# Patient Record
Sex: Female | Born: 1975 | Race: Black or African American | Hispanic: No | Marital: Single | Smoking: Never smoker
Health system: Southern US, Community
[De-identification: ages and names within clinical notes are randomized; demographics above are authoritative.]

## PROBLEM LIST (undated history)

## (undated) DIAGNOSIS — R42 Dizziness and giddiness: Secondary | ICD-10-CM

## (undated) DIAGNOSIS — I890 Lymphedema, not elsewhere classified: Secondary | ICD-10-CM

## (undated) DIAGNOSIS — E785 Hyperlipidemia, unspecified: Secondary | ICD-10-CM

## (undated) DIAGNOSIS — M1990 Unspecified osteoarthritis, unspecified site: Secondary | ICD-10-CM

## (undated) DIAGNOSIS — G43909 Migraine, unspecified, not intractable, without status migrainosus: Secondary | ICD-10-CM

## (undated) DIAGNOSIS — D219 Benign neoplasm of connective and other soft tissue, unspecified: Secondary | ICD-10-CM

## (undated) DIAGNOSIS — J45909 Unspecified asthma, uncomplicated: Secondary | ICD-10-CM

## (undated) HISTORY — DX: Migraine, unspecified, not intractable, without status migrainosus: G43.909

## (undated) HISTORY — DX: Benign neoplasm of connective and other soft tissue, unspecified: D21.9

## (undated) HISTORY — DX: Dizziness and giddiness: R42

## (undated) HISTORY — DX: Unspecified osteoarthritis, unspecified site: M19.90

## (undated) HISTORY — DX: Hyperlipidemia, unspecified: E78.5

## (undated) HISTORY — PX: KNEE ARTHROSCOPY: SUR90

## (undated) HISTORY — DX: Unspecified asthma, uncomplicated: J45.909

## (undated) HISTORY — DX: Lymphedema, not elsewhere classified: I89.0

---

## 2000-04-06 DIAGNOSIS — R002 Palpitations: Secondary | ICD-10-CM | POA: Insufficient documentation

## 2005-12-26 IMAGING — CR DG CHEST 2V
1 series · 2 of 2 positions shown · non-contrast
Comparison: none

REASON FOR EXAM: chest pain
COMMENTS:

PROCEDURE:     DXR - DXR CHEST PA (OR AP) AND LATERAL  - [DATE]  [DATE]
RESULT:       The lung fields are clear. The heart, mediastinal and osseous
structures show no significant abnormalities.

[Series 1: view not recorded · 0.17mm/px · 2 of 2 slices shown]
[im 1/2]
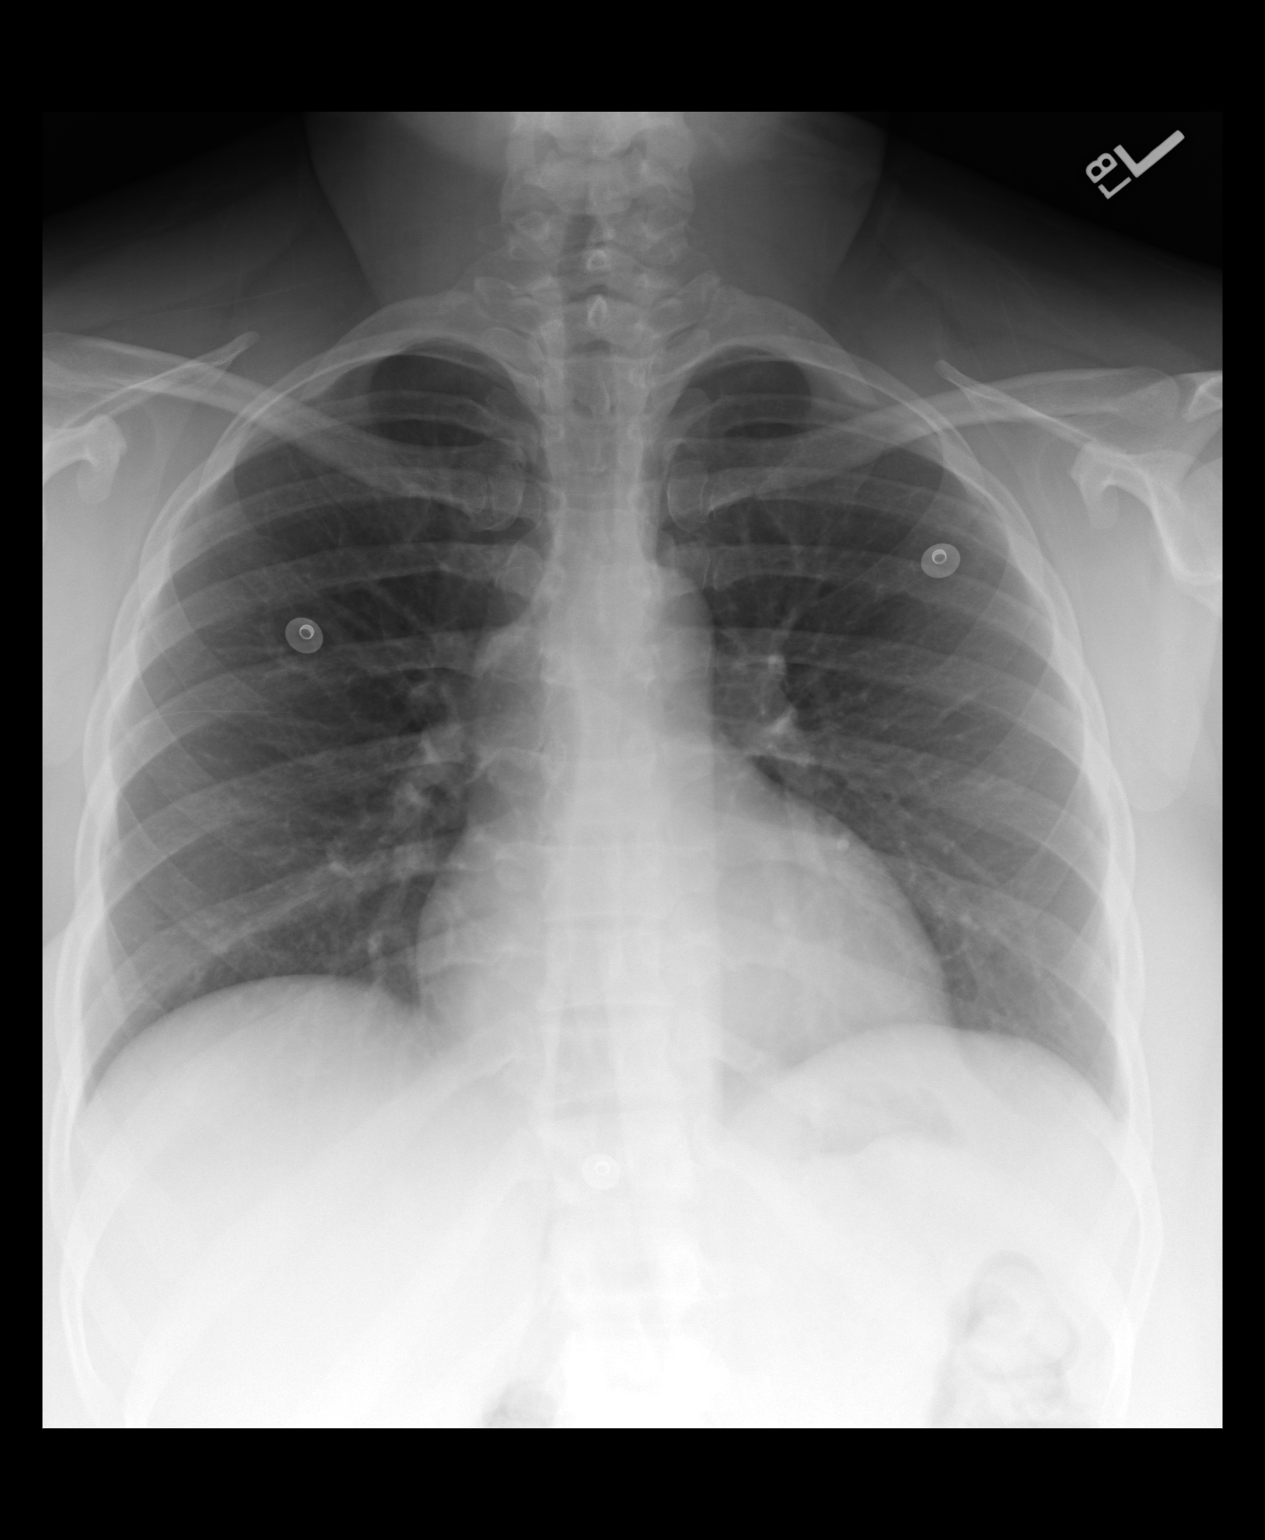
[im 2/2]
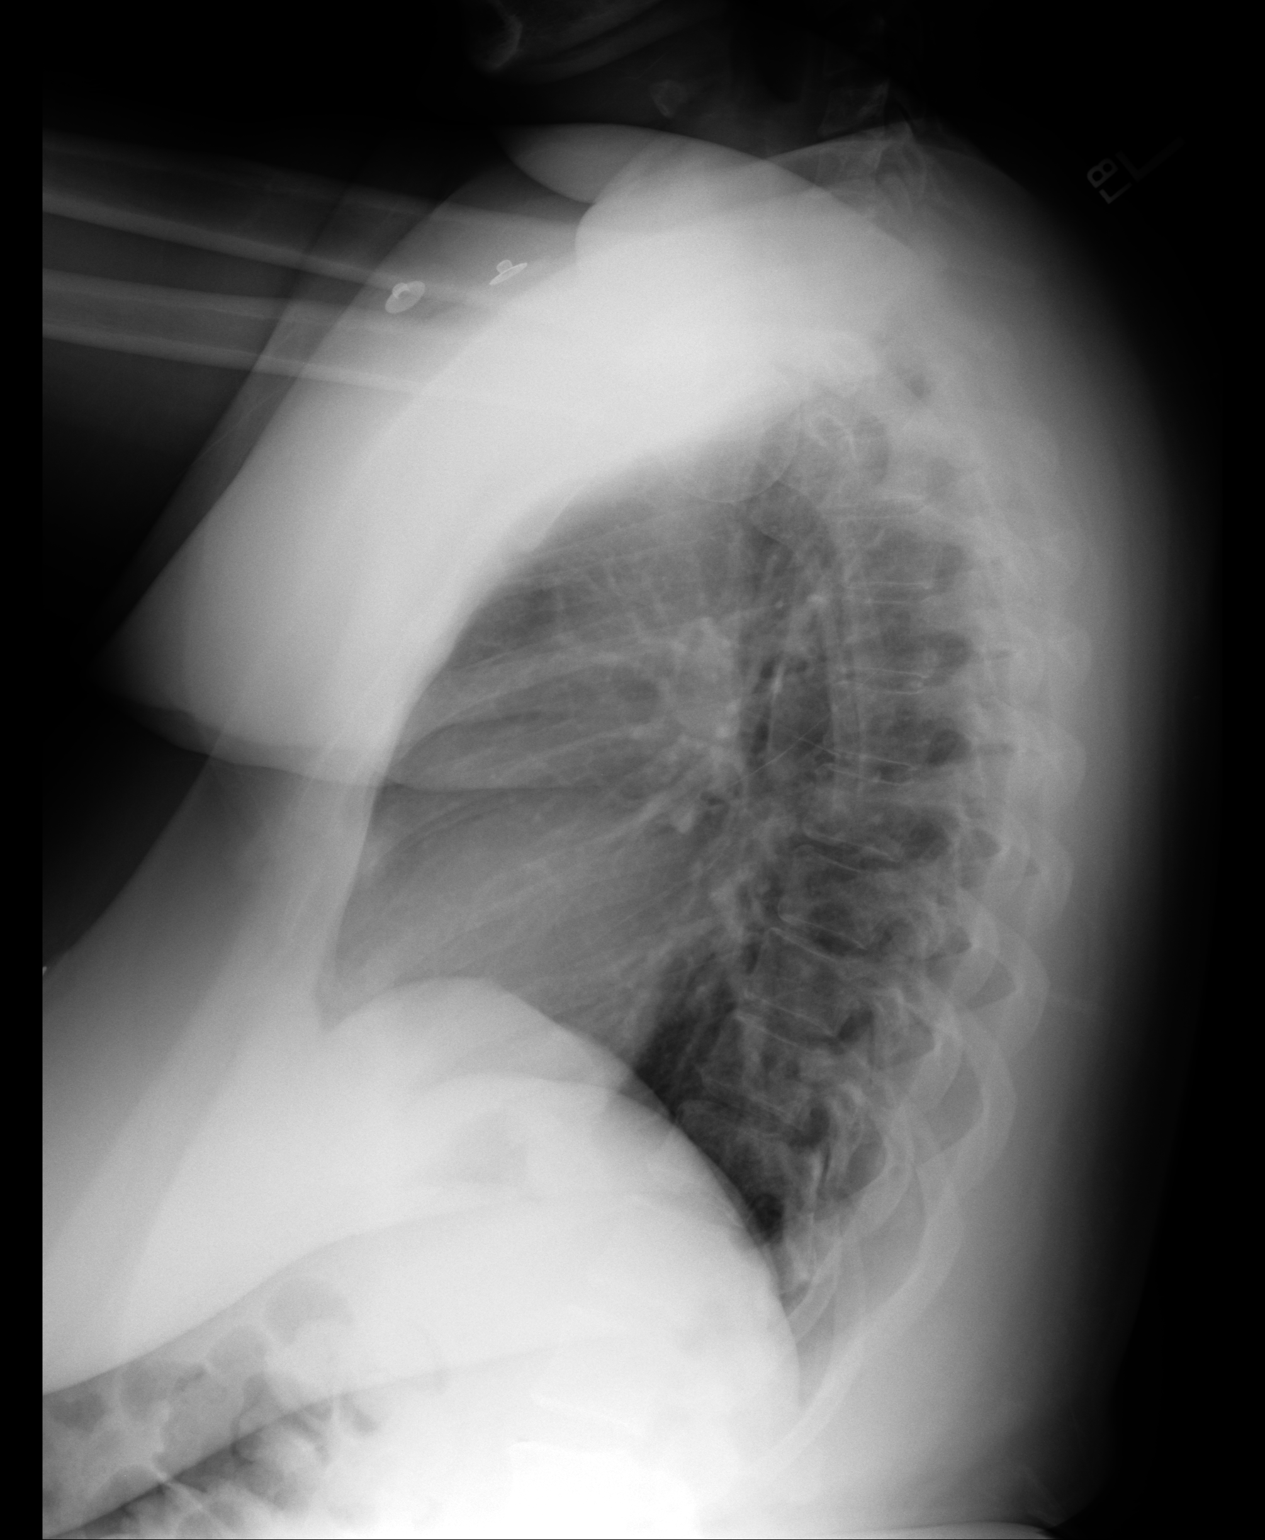

[2 of 2 positions shown; findings below may reference images not displayed]

IMPRESSION: No acute change is identified.

## 2006-11-10 ENCOUNTER — Other Ambulatory Visit: Payer: Self-pay

## 2006-11-10 ENCOUNTER — Emergency Department: Payer: Self-pay | Admitting: Unknown Physician Specialty

## 2006-11-10 ENCOUNTER — Ambulatory Visit: Payer: Self-pay | Admitting: Internal Medicine

## 2014-03-27 ENCOUNTER — Ambulatory Visit: Payer: Self-pay | Admitting: Family Medicine

## 2017-03-16 DIAGNOSIS — M25469 Effusion, unspecified knee: Secondary | ICD-10-CM | POA: Insufficient documentation

## 2017-03-16 DIAGNOSIS — M25561 Pain in right knee: Secondary | ICD-10-CM | POA: Insufficient documentation

## 2018-02-18 ENCOUNTER — Encounter: Payer: Self-pay | Admitting: *Deleted

## 2018-03-08 ENCOUNTER — Ambulatory Visit: Payer: BC Managed Care – PPO | Admitting: Gastroenterology

## 2018-03-29 ENCOUNTER — Ambulatory Visit: Payer: BC Managed Care – PPO | Admitting: Gastroenterology

## 2019-03-07 DIAGNOSIS — J45909 Unspecified asthma, uncomplicated: Secondary | ICD-10-CM | POA: Insufficient documentation

## 2019-04-05 ENCOUNTER — Ambulatory Visit: Payer: BC Managed Care – PPO | Admitting: Gastroenterology

## 2021-05-27 ENCOUNTER — Other Ambulatory Visit: Payer: Self-pay | Admitting: Rheumatology

## 2021-05-27 DIAGNOSIS — M1220 Villonodular synovitis (pigmented), unspecified site: Secondary | ICD-10-CM

## 2021-06-16 ENCOUNTER — Other Ambulatory Visit: Payer: Self-pay | Admitting: Rheumatology

## 2021-06-16 ENCOUNTER — Ambulatory Visit
Admission: RE | Admit: 2021-06-16 | Discharge: 2021-06-16 | Disposition: A | Discharge status: Home / Self Care | Payer: BC Managed Care – PPO | Source: Ambulatory Visit | Attending: Rheumatology | Admitting: Rheumatology

## 2021-06-16 DIAGNOSIS — M1220 Villonodular synovitis (pigmented), unspecified site: Secondary | ICD-10-CM | POA: Insufficient documentation

## 2021-06-16 IMAGING — MR MR KNEE*R* W/O CM
9 series · 40 of 40 positions shown · non-contrast
Comparison: None Available.

CLINICAL DATA: Bilateral knee pain and swelling.

EXAM:
MRI OF THE RIGHT KNEE WITHOUT CONTRAST
TECHNIQUE: Multiplanar, multisequence MR imaging of the right was performed. No
intravenous contrast was administered.

[Series 4: PD fat-sat · axial · right · 3.0mm · 0.47mm/px · z∈[-41,+93]mm · 5 of 42 slices shown (1 of 5)]
[im 1/42]
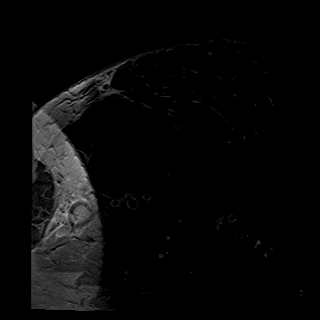
[im 11/42]
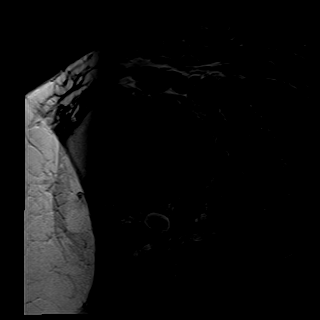
[im 21/42]
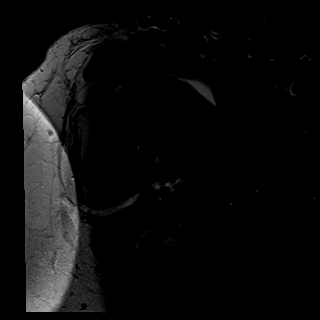
[im 31/42]
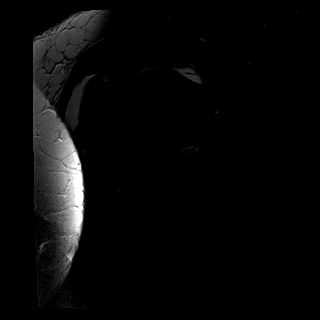
[im 42/42]
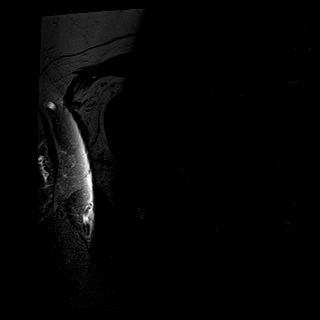

[Series 5: PD fat-sat · axial · right · 3.0mm · 0.50mm/px · z∈[-42,+92]mm · 5 of 42 slices shown (2 of 5)]
[im 1/42]
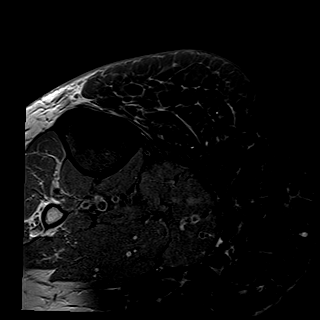
[im 11/42]
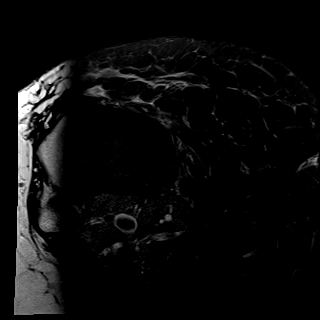
[im 21/42]
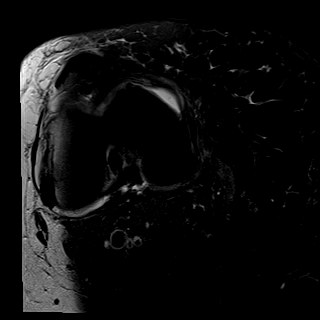
[im 31/42]
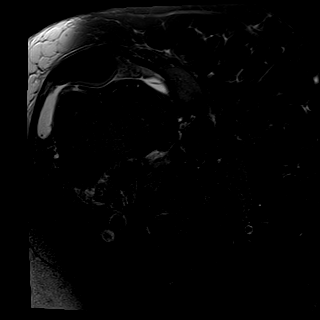
[im 42/42]
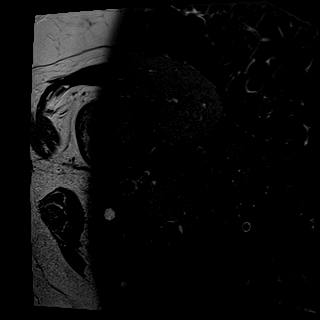

[Series 6: T1 · coronal · right · 3.0mm · 0.49mm/px · 4 of 40 slices shown]
[im 1/40]
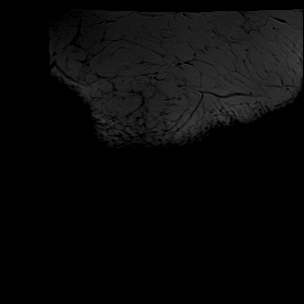
[im 14/40]
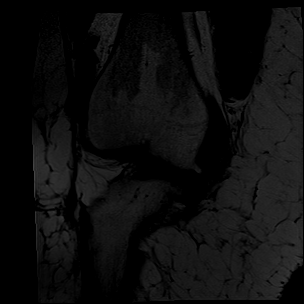
[im 27/40]
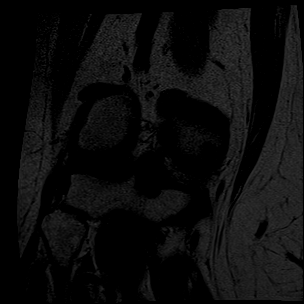
[im 40/40]
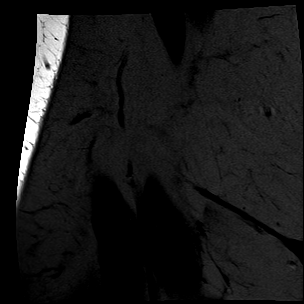

[Series 7: T2 fat-sat · coronal · right · 3.0mm · 0.59mm/px · 4 of 42 slices shown (1 of 2)]
[im 1/42]
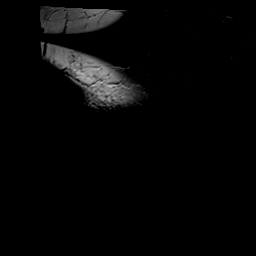
[im 14/42]
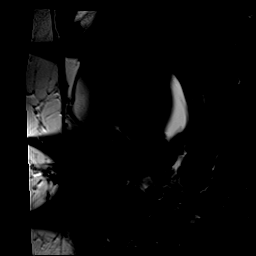
[im 28/42]
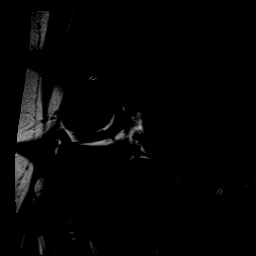
[im 42/42]
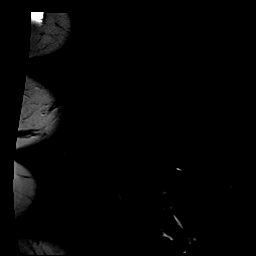

[Series 8: PD fat-sat · coronal · right · 3.0mm · 0.47mm/px · 4 of 42 slices shown (3 of 5)]
[im 1/42]
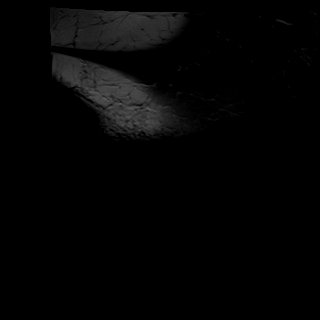
[im 14/42]
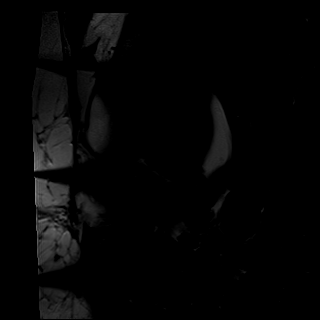
[im 28/42]
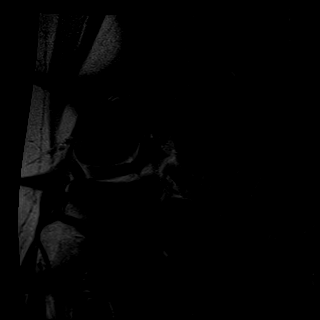
[im 42/42]
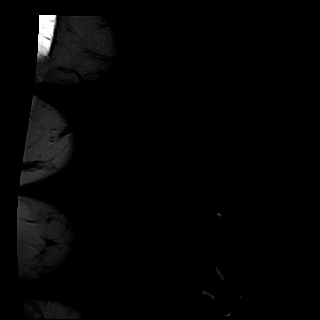

[Series 9: PD fat-sat · sagittal · right · 2.5mm · 0.52mm/px · 4 of 42 slices shown (4 of 5)]
[im 1/42]
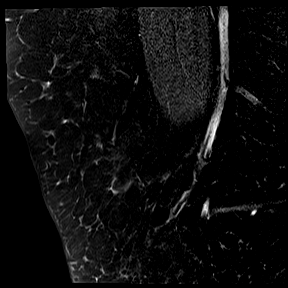
[im 14/42]
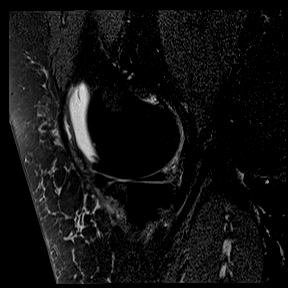
[im 28/42]
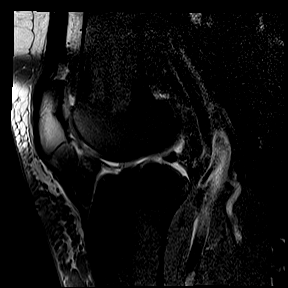
[im 42/42]
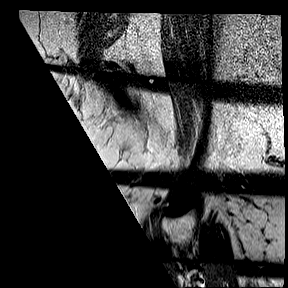

[Series 11: T2 fat-sat · sagittal · right · 2.5mm · 0.47mm/px · 5 of 44 slices shown (2 of 2)]
[im 1/44]
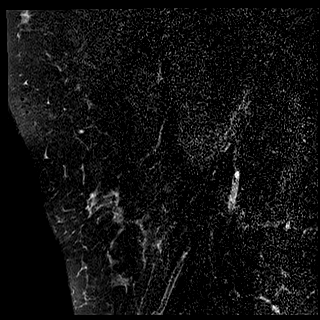
[im 11/44]
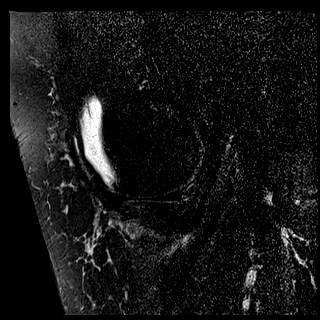
[im 22/44]
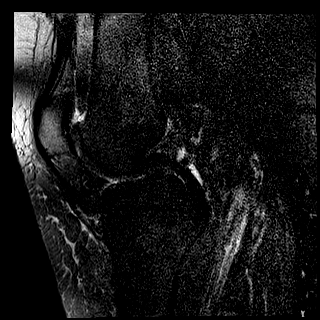
[im 33/44]
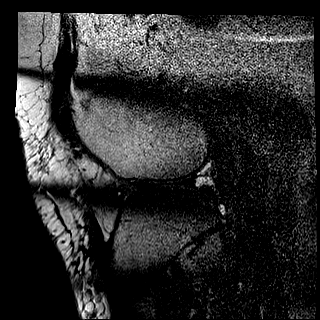
[im 44/44]
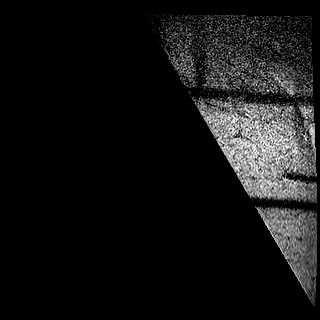

[Series 13: PD fat-sat · sagittal · right · 2.5mm · 0.52mm/px · 5 of 44 slices shown (5 of 5)]
[im 1/44]
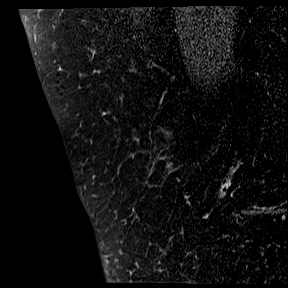
[im 11/44]
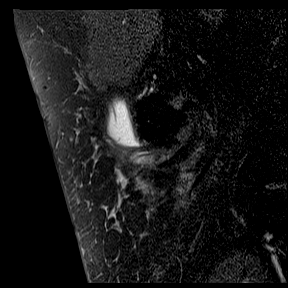
[im 22/44]
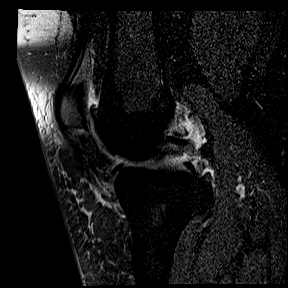
[im 33/44]
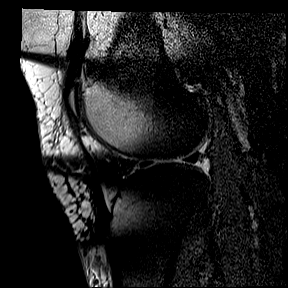
[im 44/44]
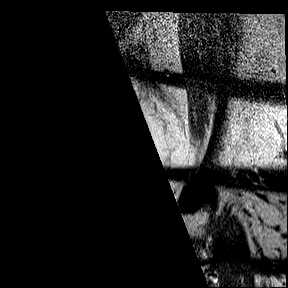

[Series 14: T1 fat-sat · axial · non-contrast · right · 3.0mm · 0.52mm/px · z∈[-39,+95]mm · 4 of 42 slices shown]
[im 1/42]
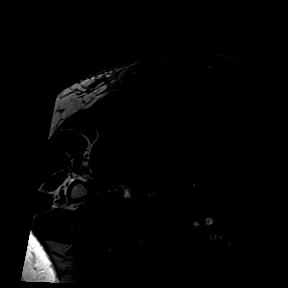
[im 14/42]
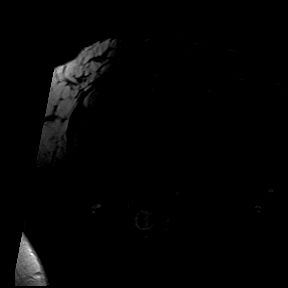
[im 28/42]
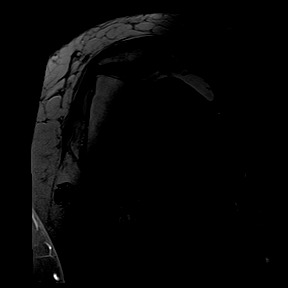
[im 42/42]
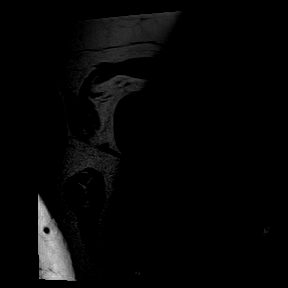

[40 of 40 positions shown; findings below may reference images not displayed]

FINDINGS: MENISCI

Medial: Intact.

Lateral: Intact.

LIGAMENTS

Cruciates: ACL and PCL are intact.

Collaterals: Medial collateral ligament is intact. Lateral
collateral ligament complex is intact.

CARTILAGE

Patellofemoral:  No chondral defect.

Medial: Mild partial-thickness cartilage loss of the medial
femorotibial compartment.

Lateral:  No chondral defect.

JOINT: Small joint effusion. Normal LAHIRU. No plical
thickening.

POPLITEAL FOSSA: Popliteus tendon is intact. No Baker's cyst.

EXTENSOR MECHANISM: Intact quadriceps tendon. Intact patellar
tendon. Intact lateral patellar retinaculum. Intact medial patellar
retinaculum. Intact MPFL.

BONES: No aggressive osseous lesion. No fracture or dislocation.

Other: No fluid collection or hematoma. Muscles are normal.
IMPRESSION: 1.  Medial and lateral menisci are intact.

2.  Cruciate and collateral ligaments are also intact.

3.  No evidence of fracture or osteonecrosis.

4. No evidence of cartilage loss to suggest degenerative/traumatic
arthropathy.

5.  Small nonspecific suprapatellar joint effusion.

## 2021-06-16 IMAGING — MR MR KNEE*L* W/O CM
7 series · 40 of 40 positions shown · non-contrast
Comparison: None Available.

CLINICAL DATA: Bilateral knee pain.  Arthritis.

EXAM:
MRI OF THE LEFT KNEE WITHOUT CONTRAST
TECHNIQUE: Multiplanar, multisequence MR imaging of the left was performed. No
intravenous contrast was administered.

[Series 5: PD fat-sat · axial · left · 3.0mm · 0.47mm/px · z∈[-60,+54]mm · 5 of 36 slices shown (1 of 3)]
[im 1/36]
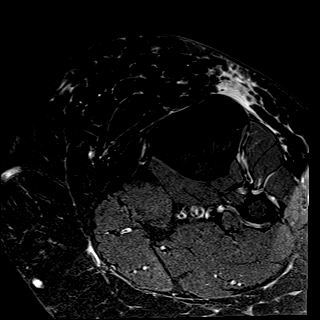
[im 9/36]
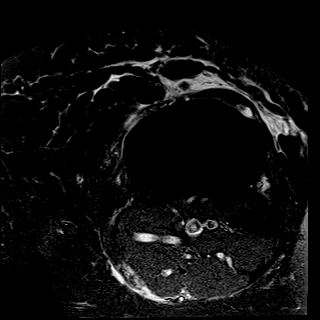
[im 18/36]
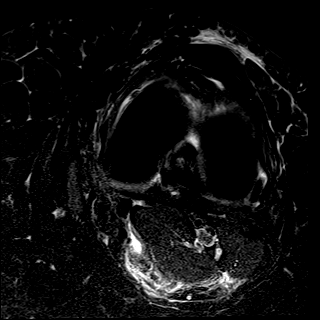
[im 27/36]
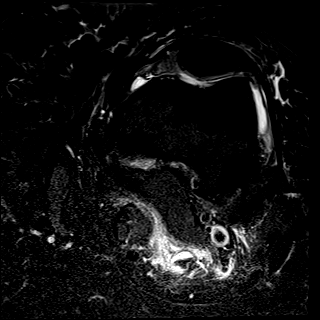
[im 36/36]
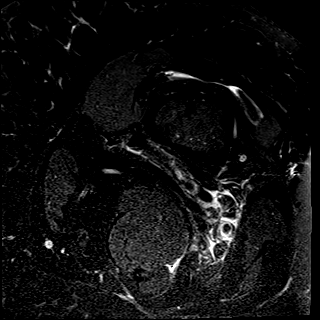

[Series 6: T1 · coronal · left · 3.0mm · 0.49mm/px · 7 of 44 slices shown]
[im 1/44]
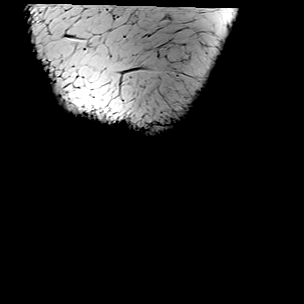
[im 8/44]
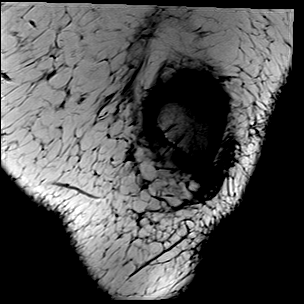
[im 15/44]
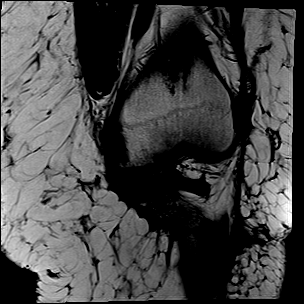
[im 22/44]
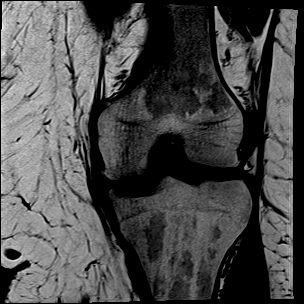
[im 29/44]
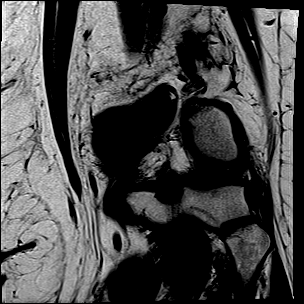
[im 36/44]
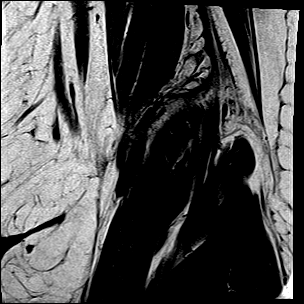
[im 44/44]
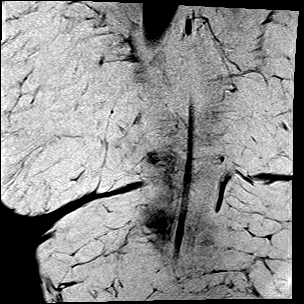

[Series 7: T2 fat-sat · coronal · left · 3.0mm · 0.59mm/px · 6 of 42 slices shown (1 of 2)]
[im 1/42]
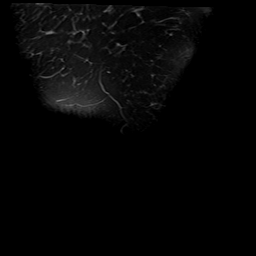
[im 9/42]
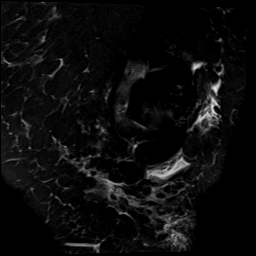
[im 17/42]
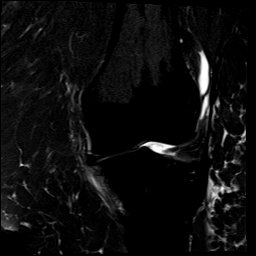
[im 25/42]
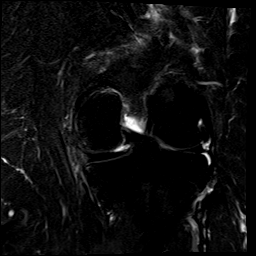
[im 33/42]
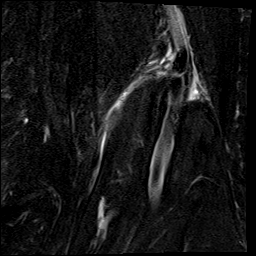
[im 42/42]
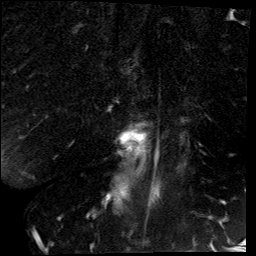

[Series 8: PD fat-sat · coronal · left · 3.0mm · 0.47mm/px · 6 of 42 slices shown (2 of 3)]
[im 1/42]
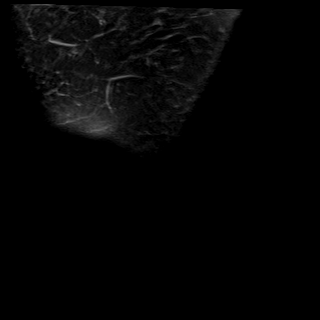
[im 9/42]
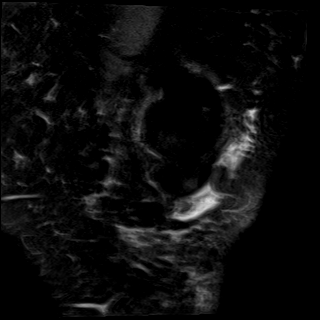
[im 17/42]
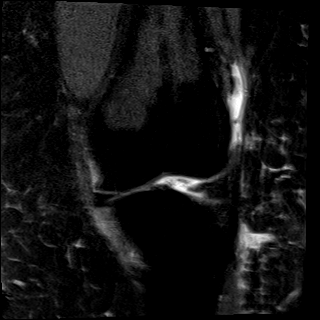
[im 25/42]
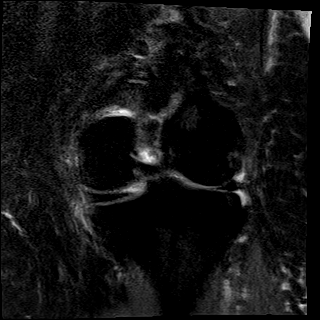
[im 33/42]
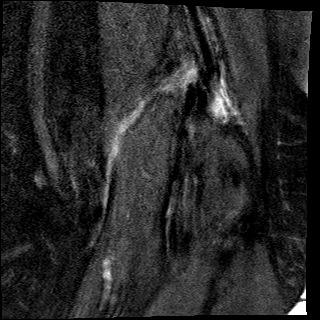
[im 42/42]
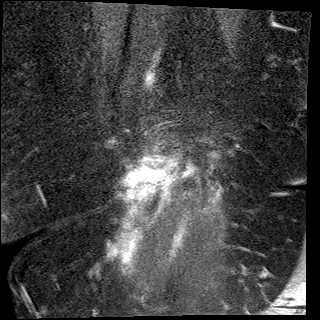

[Series 10: T2 fat-sat · sagittal · left · 3.0mm · 0.62mm/px · 5 of 34 slices shown (2 of 2)]
[im 1/34]
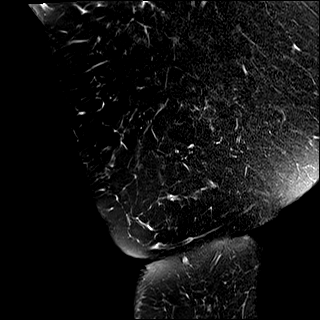
[im 9/34]
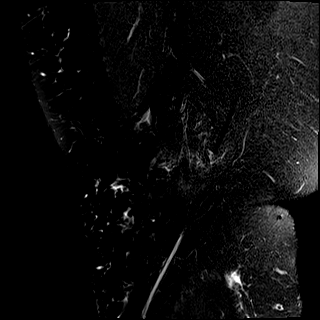
[im 17/34]
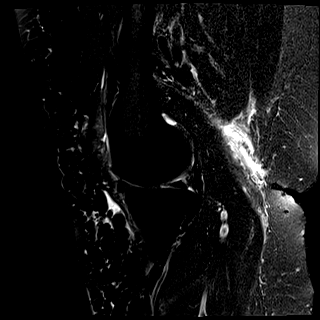
[im 25/34]
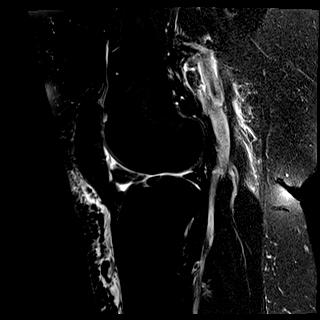
[im 34/34]
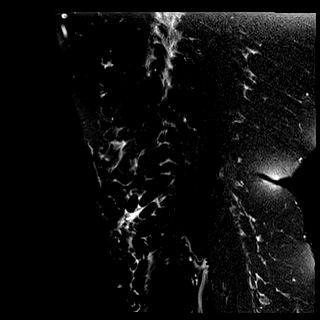

[Series 11: PD fat-sat · sagittal · left · 2.5mm · 0.69mm/px · 7 of 44 slices shown (3 of 3)]
[im 1/44]
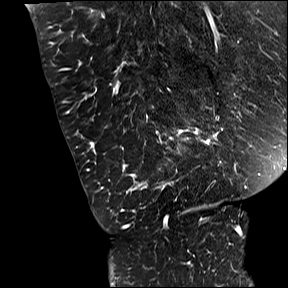
[im 8/44]
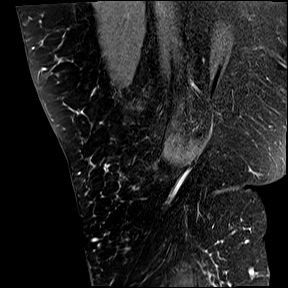
[im 15/44]
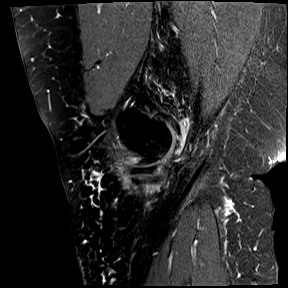
[im 22/44]
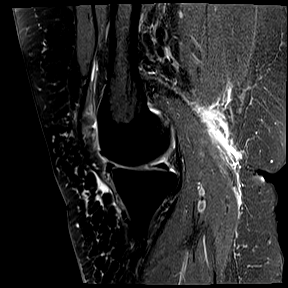
[im 29/44]
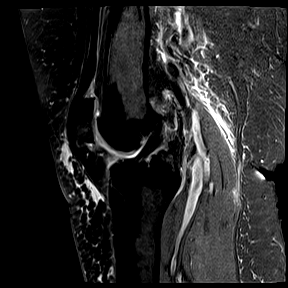
[im 36/44]
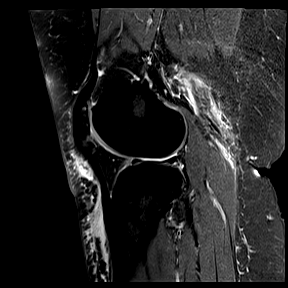
[im 44/44]
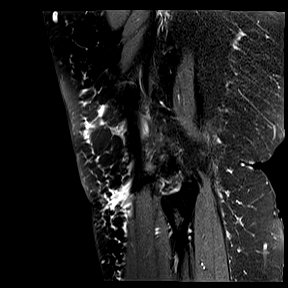

[Series 12: T1 fat-sat · axial · non-contrast · left · 4.0mm · 0.52mm/px · z∈[-65,+59]mm · 4 of 26 slices shown]
[im 1/26]
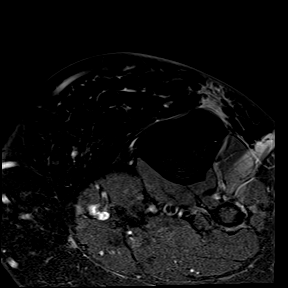
[im 9/26]
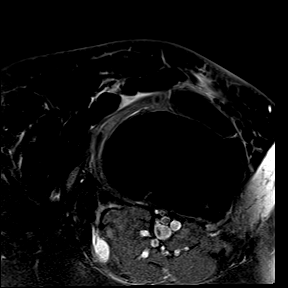
[im 17/26]
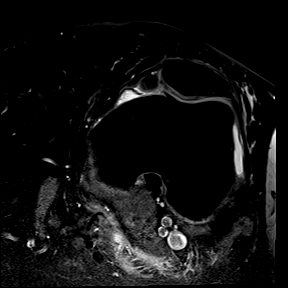
[im 26/26]
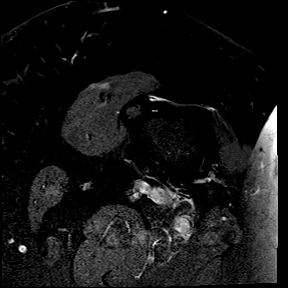

[40 of 40 positions shown; findings below may reference images not displayed]

FINDINGS: MENISCI

Medial: Intact.

Lateral: Intact.

LIGAMENTS

Cruciates: ACL and PCL are intact.

Collaterals: Medial collateral ligament is intact. Lateral
collateral ligament complex is intact.

CARTILAGE

Patellofemoral: Full-thickness cartilage defect and subchondral
cystic changes of the patellar apex and the medial and lateral
patellar facet (series 5, image 14).

Medial: Mild partial-thickness cartilage loss of the medial
femorotibial compartment.

Lateral:  No chondral defect.

JOINT: Small joint effusion. Normal MURATALLA. No plical
thickening.

POPLITEAL FOSSA: Popliteus tendon is intact. Small Baker's cyst
measuring approximately 1.6 x 0.9 x 3.4 cm. There is edema
superficial to the medial head of the gastrocnemius concerning for
ruptured Baker's cyst (series 5 images 9-16).

EXTENSOR MECHANISM: Intact quadriceps tendon. Intact patellar
tendon. Intact lateral patellar retinaculum. Intact medial patellar
retinaculum. Intact MPFL.

BONES: No aggressive osseous lesion. No fracture or dislocation.
Small tricompartmental marginal osteophytes.

Other: No fluid collection or hematoma. Muscles are normal.
Nonspecific prepatellar/pretibial soft tissue edema.
IMPRESSION: 1. Moderate patellofemoral osteoarthritis with subchondral cystic
changes and multifocal full-thickness cartilage defect. Small
reactive joint effusion.

2. Mild medial tibiofemoral osteoarthritis with partial-thickness
cartilage loss. Medial and lateral tibiofemoral compartment marginal
osteophytes consistent with mild osteoarthritis.

3. Baker's cyst measuring 1.6 x 0.9 x 3.4 cm. Moderate amount of
fluid superficial to the medial head of the gastrocnemius,
concerning for ruptured Baker's cyst.

4.  No evidence of fracture or dislocation.

5. Menisci are intact. Cruciate and collateral ligaments are also
intact.

## 2021-06-16 MED ORDER — GADOBUTROL 1 MMOL/ML IV SOLN: 10.0000 mL | Freq: Once | INTRAVENOUS | Status: DC | PRN

## 2021-10-24 ENCOUNTER — Other Ambulatory Visit (INDEPENDENT_AMBULATORY_CARE_PROVIDER_SITE_OTHER): Payer: Self-pay | Admitting: Nurse Practitioner

## 2021-10-24 DIAGNOSIS — M79604 Pain in right leg: Secondary | ICD-10-CM

## 2021-10-27 ENCOUNTER — Ambulatory Visit (INDEPENDENT_AMBULATORY_CARE_PROVIDER_SITE_OTHER): Payer: Self-pay

## 2021-10-27 ENCOUNTER — Ambulatory Visit (INDEPENDENT_AMBULATORY_CARE_PROVIDER_SITE_OTHER): Payer: BC Managed Care – PPO | Admitting: Vascular Surgery

## 2021-10-27 ENCOUNTER — Encounter (INDEPENDENT_AMBULATORY_CARE_PROVIDER_SITE_OTHER): Payer: Self-pay | Admitting: Vascular Surgery

## 2021-10-27 VITALS — BP 130/68 | HR 83 | Resp 16 | Wt 308.0 lb

## 2021-10-27 DIAGNOSIS — M79604 Pain in right leg: Secondary | ICD-10-CM

## 2021-10-27 DIAGNOSIS — M79605 Pain in left leg: Secondary | ICD-10-CM

## 2021-10-27 DIAGNOSIS — I872 Venous insufficiency (chronic) (peripheral): Secondary | ICD-10-CM | POA: Diagnosis not present

## 2021-10-27 DIAGNOSIS — J45909 Unspecified asthma, uncomplicated: Secondary | ICD-10-CM | POA: Diagnosis not present

## 2021-10-27 DIAGNOSIS — I890 Lymphedema, not elsewhere classified: Secondary | ICD-10-CM | POA: Diagnosis not present

## 2021-10-27 NOTE — Progress Notes (Signed)
MRN : [id number]  Robin Hoffman is a 46 y.o. ([birth year]) female who presents with chief complaint of check circulation.  History of Present Illness:   Patient is seen for evaluation of leg pain and leg swelling. The patient first noticed the swelling remotely. The swelling is associated with pain and discoloration. The pain and swelling worsens with prolonged dependency and improves with elevation. The pain is unrelated to activity.  The patient notes that in the morning the legs are significantly improved but they steadily worsened throughout the course of the day. The patient also notes a steady worsening of the discoloration in the ankle and shin area.   The patient denies claudication symptoms.  The patient denies symptoms consistent with rest pain.  The patient denies and extensive history of DJD and LS spine disease.  The patient has no had any past angiography, interventions or vascular surgery.  Elevation makes the leg symptoms better, dependency makes them much worse. There is no history of ulcerations. The patient denies any recent changes in medications.  The patient has not been wearing graduated compression.  The patient denies a history of DVT or PE. There is no prior history of phlebitis. There is no history of primary lymphedema.  No history of malignancies. No history of trauma or groin or pelvic surgery. There is no history of radiation treatment to the groin or pelvis  The patient denies amaurosis fugax or recent TIA symptoms. There are no recent neurological changes noted. The patient denies recent episodes of angina or shortness of breath.    ABI's obtained today are normal bilaterally  Social History    Family History No family history on file.  Not on File   REVIEW OF SYSTEMS (Negative unless checked)  Constitutional: '[]'$ Weight loss  '[]'$ Fever  '[]'$ Chills Cardiac: '[]'$ Chest pain   '[]'$ Chest pressure   '[]'$ Palpitations   '[]'$ Shortness of  breath when laying flat   '[]'$ Shortness of breath with exertion. Vascular:  '[x]'$ Pain in legs with walking   '[]'$ Pain in legs at rest  '[]'$ History of DVT   '[]'$ Phlebitis   '[]'$ Swelling in legs   '[]'$ Varicose veins   '[]'$ Non-healing ulcers Pulmonary:   '[]'$ Uses home oxygen   '[]'$ Productive cough   '[]'$ Hemoptysis   '[]'$ Wheeze  '[]'$ COPD   '[]'$ Asthma Neurologic:  '[]'$ Dizziness   '[]'$ Seizures   '[]'$ History of stroke   '[]'$ History of TIA  '[]'$ Aphasia   '[]'$ Vissual changes   '[]'$ Weakness or numbness in arm   '[]'$ Weakness or numbness in leg Musculoskeletal:   '[]'$ Joint swelling   '[]'$ Joint pain   '[]'$ Low back pain Hematologic:  '[]'$ Easy bruising  '[]'$ Easy bleeding   '[]'$ Hypercoagulable state   '[]'$ Anemic Gastrointestinal:  '[]'$ Diarrhea   '[]'$ Vomiting  '[]'$ Gastroesophageal reflux/heartburn   '[]'$ Difficulty swallowing. Genitourinary:  '[]'$ Chronic kidney disease   '[]'$ Difficult urination  '[]'$ Frequent urination   '[]'$ Blood in urine Skin:  '[]'$ Rashes   '[]'$ Ulcers  Psychological:  '[]'$ History of anxiety   '[]'$  History of major depression.  Physical Examination  There were no vitals filed for this visit. There is no height or weight on file to calculate BMI. Gen: WD/WN, NAD Head: [place]/AT, No temporalis wasting.  Ear/Nose/Throat: Hearing grossly intact, nares w/o erythema or drainage Eyes: PER, EOMI, sclera nonicteric.  Neck: Supple, no masses.  No bruit or JVD.  Pulmonary:  Good air movement, no audible wheezing, no use of accessory muscles.  Cardiac: RRR, normal S1, S2, no Murmurs. Vascular:  scattered small varicosities  and reticular veins, trace venous skin changes  2-3+ edema, no open wounds Vessel Right Left  Radial Palpable Palpable  PT Palpable Palpable  DP Palpable Palpable  Gastrointestinal: soft, non-distended. No guarding/no peritoneal signs.  Musculoskeletal: M/S 5/5 throughout.  No visible deformity.  Neurologic: CN 2-12 intact. Pain and light touch intact in extremities.  Symmetrical.  Speech is fluent. Motor exam as listed above. Psychiatric: Judgment intact, Mood &  affect appropriate for pt's clinical situation. Dermatologic: No rashes or ulcers noted.  No changes consistent with cellulitis.   CBC No results found for: "WBC", "HGB", "HCT", "MCV", "PLT"  BMET No results found for: "NA", "K", "CL", "CO2", "GLUCOSE", "BUN", "CREATININE", "CALCIUM", "GFRNONAA", "GFRAA" CrCl cannot be calculated (No successful lab value found.).  COAG No results found for: "INR", "PROTIME"  Radiology No results found.   Assessment/Plan 1. Bilateral leg pain Recommend:  No surgery or intervention at this point in time.    I have reviewed my discussion with the patient regarding lymphedema and why it  causes symptoms.  Patient will continue wearing graduated compression on a daily basis. The patient should put the compression on first thing in the morning and removing them in the evening. The patient should not sleep in the compression.   In addition, behavioral modification throughout the day will be continued.  This will include frequent elevation (such as in a recliner), use of over the counter pain medications as needed and exercise such as walking.  The systemic causes for chronic edema such as liver, kidney and cardiac etiologies do not appear to have significant changed over the past year.    Despite conservative treatments including graduated compression therapy class 1 and behavioral modification including exercise and elevation the patient  has not obtained adequate control of the lymphedema.  The patient still has stage 3 lymphedema and therefore, I believe that a lymph pump should be added to improve the control of the patient's lymphedema.  Additionally, a lymph pump is warranted because it will reduce the risk of cellulitis and ulceration in the future.  Patient should follow-up in six months   - VAS [place] ABI WITH/WO TBI  2. Lymphedema Recommend:  No surgery or intervention at this point in time.    I have reviewed my discussion with the patient  regarding lymphedema and why it  causes symptoms.  Patient will continue wearing graduated compression on a daily basis. The patient should put the compression on first thing in the morning and removing them in the evening. The patient should not sleep in the compression.   In addition, behavioral modification throughout the day will be continued.  This will include frequent elevation (such as in a recliner), use of over the counter pain medications as needed and exercise such as walking.  The systemic causes for chronic edema such as liver, kidney and cardiac etiologies do not appear to have significant changed over the past year.    Despite conservative treatments including graduated compression therapy class 1 and behavioral modification including exercise and elevation the patient  has not obtained adequate control of the lymphedema.  The patient still has stage 3 lymphedema and therefore, I believe that a lymph pump should be added to improve the control of the patient's lymphedema.  Additionally, a lymph pump is warranted because it will reduce the risk of cellulitis and ulceration in the future.  Patient should follow-up in six months    3. Chronic venous insufficiency Recommend:  No surgery or intervention  at this point in time.    I have reviewed my discussion with the patient regarding lymphedema and why it  causes symptoms.  Patient will continue wearing graduated compression on a daily basis. The patient should put the compression on first thing in the morning and removing them in the evening. The patient should not sleep in the compression.   In addition, behavioral modification throughout the day will be continued.  This will include frequent elevation (such as in a recliner), use of over the counter pain medications as needed and exercise such as walking.  The systemic causes for chronic edema such as liver, kidney and cardiac etiologies do not appear to have significant changed  over the past year.    Despite conservative treatments including graduated compression therapy class 1 and behavioral modification including exercise and elevation the patient  has not obtained adequate control of the lymphedema.  The patient still has stage 3 lymphedema and therefore, I believe that a lymph pump should be added to improve the control of the patient's lymphedema.  Additionally, a lymph pump is warranted because it will reduce the risk of cellulitis and ulceration in the future.  Patient should follow-up in six months    4. Uncomplicated asthma, unspecified asthma severity, unspecified whether persistent Continue pulmonary medications and aerosols as already ordered, these medications have been reviewed and there are no changes at this time.     Hortencia Pilar, MD  10/27/2021 3:50 PM

## 2021-10-28 ENCOUNTER — Encounter (INDEPENDENT_AMBULATORY_CARE_PROVIDER_SITE_OTHER): Payer: Self-pay | Admitting: Vascular Surgery

## 2021-10-31 ENCOUNTER — Encounter (INDEPENDENT_AMBULATORY_CARE_PROVIDER_SITE_OTHER): Payer: Self-pay | Admitting: Vascular Surgery

## 2021-10-31 DIAGNOSIS — I890 Lymphedema, not elsewhere classified: Secondary | ICD-10-CM | POA: Insufficient documentation

## 2021-10-31 DIAGNOSIS — I872 Venous insufficiency (chronic) (peripheral): Secondary | ICD-10-CM | POA: Insufficient documentation

## 2021-10-31 MED ORDER — FUROSEMIDE 20 MG PO TABS: 20.0000 mg | ORAL_TABLET | ORAL | 0 refills | Status: AC | PRN

## 2021-11-07 NOTE — Telephone Encounter (Signed)
Patient will be bringing FMLA paperwork by the office

## 2021-12-01 ENCOUNTER — Encounter (INDEPENDENT_AMBULATORY_CARE_PROVIDER_SITE_OTHER): Payer: Self-pay

## 2022-04-30 ENCOUNTER — Ambulatory Visit (INDEPENDENT_AMBULATORY_CARE_PROVIDER_SITE_OTHER): Payer: BC Managed Care – PPO | Admitting: Vascular Surgery

## 2022-06-22 ENCOUNTER — Ambulatory Visit (INDEPENDENT_AMBULATORY_CARE_PROVIDER_SITE_OTHER): Payer: BC Managed Care – PPO | Admitting: Vascular Surgery

## 2022-06-22 ENCOUNTER — Encounter (INDEPENDENT_AMBULATORY_CARE_PROVIDER_SITE_OTHER): Payer: Self-pay | Admitting: Vascular Surgery

## 2022-06-22 VITALS — BP 136/80 | HR 81 | Resp 18 | Ht 60.0 in | Wt 313.4 lb

## 2022-06-22 DIAGNOSIS — I872 Venous insufficiency (chronic) (peripheral): Secondary | ICD-10-CM | POA: Diagnosis not present

## 2022-06-22 DIAGNOSIS — I890 Lymphedema, not elsewhere classified: Secondary | ICD-10-CM | POA: Diagnosis not present

## 2022-06-22 DIAGNOSIS — M25561 Pain in right knee: Secondary | ICD-10-CM | POA: Diagnosis not present

## 2022-06-22 DIAGNOSIS — J45909 Unspecified asthma, uncomplicated: Secondary | ICD-10-CM

## 2022-06-22 DIAGNOSIS — G8929 Other chronic pain: Secondary | ICD-10-CM

## 2022-06-22 NOTE — Progress Notes (Signed)
MRN : [id number]  Robin Hoffman is a 47 y.o. (22-Feb-1975) female who presents with chief complaint of legs swell.  History of Present Illness:   The patient returns to the office for followup evaluation regarding leg swelling.  The swelling has persisted and the pain associated with swelling continues. There have not been any interval development of a ulcerations or wounds.  Since the previous visit the patient has been wearing graduated compression stockings and has noted little if any improvement in the lymphedema. The patient has been using compression routinely morning until night.  The patient also states elevation during the day and exercise is being done too.  No outpatient medications have been marked as taking for the 06/22/22 encounter (Office Visit) with Gilda Crease, Latina Craver, MD.    Past Medical History:  Diagnosis Date   Arthritis    Asthma    Fibroids    Hyperlipidemia    Migraines    Vertigo     Past Surgical History:  Procedure Laterality Date   CESAREAN SECTION     4   KNEE ARTHROSCOPY Right     Social History Social History   Tobacco Use   Smoking status: Never   Smokeless tobacco: Never  Vaping Use   Vaping Use: Never used  Substance Use Topics   Alcohol use: Never   Drug use: Never    Family History Family History  Problem Relation Age of Onset   Hypertension Mother    Cancer Mother    Thyroid disease Mother    Anemia Mother    Stroke Father    Hyperlipidemia Father    Hypertension Father    Lung disease Father    Cataracts Father    Heart attack Father     Allergies  Allergen Reactions   Hydrocodone Nausea Only    Headache/dizziness also Headache/dizziness also Headache/dizziness also    Tramadol Nausea Only    Headache and dizziness as well Headache and dizziness as well Headache and dizziness as well    Codeine Nausea Only    Headache and dizziness as well Headache and dizziness as  well Headache and dizziness as well    Penicillins     Other reaction(s): Unknown Other reaction(s): UNKNOWN In childhood records/reaction unknown Was in her childhood records-no known reaction Other reaction(s): UNKNOWN      REVIEW OF SYSTEMS (Negative unless checked)  Constitutional: [] Weight loss  [] Fever  [] Chills Cardiac: [] Chest pain   [] Chest pressure   [] Palpitations   [] Shortness of breath when laying flat   [] Shortness of breath with exertion. Vascular:  [] Pain in legs with walking   [x] Pain in legs with standing  [] History of DVT   [] Phlebitis   [x] Swelling in legs   [] Varicose veins   [] Non-healing ulcers Pulmonary:   [] Uses home oxygen   [] Productive cough   [] Hemoptysis   [] Wheeze  [x] COPD   [] Asthma Neurologic:  [] Dizziness   [] Seizures   [] History of stroke   [] History of TIA  [] Aphasia   [] Vissual changes   [] Weakness or numbness in arm   [] Weakness or numbness in leg Musculoskeletal:   [] Joint swelling   [x] Joint pain   [] Low back pain Hematologic:  [] Easy bruising  [] Easy bleeding   [] Hypercoagulable state   [] Anemic Gastrointestinal:  [] Diarrhea   [] Vomiting  [] Gastroesophageal reflux/heartburn   []   Difficulty swallowing. Genitourinary:  [] Chronic kidney disease   [] Difficult urination  [] Frequent urination   [] Blood in urine Skin:  [] Rashes   [] Ulcers  Psychological:  [] History of anxiety   []  History of major depression.  Physical Examination  There were no vitals filed for this visit. There is no height or weight on file to calculate BMI. Gen: WD/WN, NAD Head: [place]/AT, No temporalis wasting.  Ear/Nose/Throat: Hearing grossly intact, nares w/o erythema or drainage, pinna without lesions Eyes: PER, EOMI, sclera nonicteric.  Neck: Supple, no gross masses.  No JVD.  Pulmonary:  Good air movement, no audible wheezing, no use of accessory muscles.  Cardiac: RRR, precordium not hyperdynamic. Vascular:  scattered varicosities present bilaterally.  Mild venous stasis  changes to the legs bilaterally.  3-4+ soft pitting edema, CEAP C4sEpAsPr  Vessel Right Left  Radial Palpable Palpable  Gastrointestinal: soft, non-distended. No guarding/no peritoneal signs.  Musculoskeletal: M/S 5/5 throughout.  No deformity.  Neurologic: CN 2-12 intact. Pain and light touch intact in extremities.  Symmetrical.  Speech is fluent. Motor exam as listed above. Psychiatric: Judgment intact, Mood & affect appropriate for pt's clinical situation. Dermatologic: Venous rashes no ulcers noted.  No changes consistent with cellulitis. Lymph : No lichenification or skin changes of chronic lymphedema.  CBC No results found for: "WBC", "HGB", "HCT", "MCV", "PLT"  BMET No results found for: "NA", "K", "CL", "CO2", "GLUCOSE", "BUN", "CREATININE", "CALCIUM", "GFRNONAA", "GFRAA" CrCl cannot be calculated (No successful lab value found.).  COAG No results found for: "INR", "PROTIME"  Radiology No results found.   Assessment/Plan 1. Lymphedema Recommend:  No surgery or intervention at this point in time.   The Patient is CEAP C4sEpAsPr.  The patient has been wearing compression for more than 12 weeks with no or little benefit.  The patient has been exercising daily for more than 12 weeks. The patient has been elevating and taking OTC pain medications for more than 12 weeks.  None of these have have eliminated the pain related to the lymphedema or the discomfort regarding excessive swelling and venous congestion.    I have reviewed my discussion with the patient regarding lymphedema and why it  causes symptoms.  Patient will continue wearing graduated compression on a daily basis. The patient should put the compression on first thing in the morning and removing them in the evening. The patient should not sleep in the compression.   In addition, behavioral modification throughout the day will be continued.  This will include frequent elevation (such as in a recliner), use of over the  counter pain medications as needed and exercise such as walking.  The systemic causes for chronic edema such as liver, kidney and cardiac etiologies do not appear to have significant changed over the past year.    The patient has chronic , severe lymphedema with hyperpigmentation of the skin and has done MLD, skin care, medication, diet, exercise, elevation and compression for 4 weeks with no improvement,  I am recommending a lymphedema pump.  The patient still has stage 3 lymphedema and therefore, I believe that a lymph pump is needed to improve the control of the patient's lymphedema and improve the quality of life.  Additionally, a lymph pump is warranted because it will reduce the risk of cellulitis and ulceration in the future.  Patient should follow-up in six months  - VAS [place] LOWER EXTREMITY VENOUS (DVT); Future  2. Chronic venous insufficiency Recommend:  No surgery or intervention at this point in time.  The Patient is CEAP C4sEpAsPr.  The patient has been wearing compression for more than 12 weeks with no or little benefit.  The patient has been exercising daily for more than 12 weeks. The patient has been elevating and taking OTC pain medications for more than 12 weeks.  None of these have have eliminated the pain related to the lymphedema or the discomfort regarding excessive swelling and venous congestion.    I have reviewed my discussion with the patient regarding lymphedema and why it  causes symptoms.  Patient will continue wearing graduated compression on a daily basis. The patient should put the compression on first thing in the morning and removing them in the evening. The patient should not sleep in the compression.   In addition, behavioral modification throughout the day will be continued.  This will include frequent elevation (such as in a recliner), use of over the counter pain medications as needed and exercise such as walking.  The systemic causes for chronic edema such  as liver, kidney and cardiac etiologies do not appear to have significant changed over the past year.    The patient has chronic , severe lymphedema with hyperpigmentation of the skin and has done MLD, skin care, medication, diet, exercise, elevation and compression for 4 weeks with no improvement,  I am recommending a lymphedema pump.  The patient still has stage 3 lymphedema and therefore, I believe that a lymph pump is needed to improve the control of the patient's lymphedema and improve the quality of life.  Additionally, a lymph pump is warranted because it will reduce the risk of cellulitis and ulceration in the future.  Patient should follow-up in six months  - VAS [place] LOWER EXTREMITY VENOUS (DVT); Future - Ambulatory referral to Hematology / Oncology  3. Uncomplicated asthma, unspecified asthma severity, unspecified whether persistent Continue pulmonary medications and aerosols as already ordered, these medications have been reviewed and there are no changes at this time.   4. Chronic pain of right knee Continue NSAID medications as already ordered, these medications have been reviewed and there are no changes at this time.  Continued activity and therapy was stressed.    Levora Dredge, MD  06/22/2022 1:15 PM

## 2022-06-26 ENCOUNTER — Telehealth (INDEPENDENT_AMBULATORY_CARE_PROVIDER_SITE_OTHER): Payer: Self-pay

## 2022-06-26 NOTE — Telephone Encounter (Signed)
Made pt aware and she states understanding

## 2022-06-26 NOTE — Telephone Encounter (Signed)
Typically we do not prescribe Lasix to help with swelling as vascular causes of swelling are usually not responsive to lasix.

## 2022-06-30 ENCOUNTER — Encounter (INDEPENDENT_AMBULATORY_CARE_PROVIDER_SITE_OTHER): Payer: Self-pay | Admitting: Vascular Surgery

## 2022-07-01 ENCOUNTER — Ambulatory Visit (INDEPENDENT_AMBULATORY_CARE_PROVIDER_SITE_OTHER): Payer: BC Managed Care – PPO

## 2022-07-01 ENCOUNTER — Other Ambulatory Visit (INDEPENDENT_AMBULATORY_CARE_PROVIDER_SITE_OTHER): Payer: Self-pay | Admitting: Vascular Surgery

## 2022-07-01 DIAGNOSIS — J45909 Unspecified asthma, uncomplicated: Secondary | ICD-10-CM

## 2022-07-01 DIAGNOSIS — I872 Venous insufficiency (chronic) (peripheral): Secondary | ICD-10-CM

## 2022-07-01 DIAGNOSIS — I890 Lymphedema, not elsewhere classified: Secondary | ICD-10-CM

## 2022-07-01 DIAGNOSIS — G8929 Other chronic pain: Secondary | ICD-10-CM

## 2022-07-02 ENCOUNTER — Inpatient Hospital Stay: Payer: BC Managed Care – PPO

## 2022-07-02 ENCOUNTER — Encounter: Payer: Self-pay | Admitting: Oncology

## 2022-07-02 ENCOUNTER — Inpatient Hospital Stay: Payer: BC Managed Care – PPO | Attending: Oncology | Admitting: Oncology

## 2022-07-02 VITALS — BP 127/80 | HR 71 | Temp 98.7°F | Wt 310.7 lb

## 2022-07-02 DIAGNOSIS — R233 Spontaneous ecchymoses: Secondary | ICD-10-CM

## 2022-07-02 DIAGNOSIS — I872 Venous insufficiency (chronic) (peripheral): Secondary | ICD-10-CM | POA: Insufficient documentation

## 2022-07-02 LAB — CBC WITH DIFFERENTIAL/PLATELET
Abs Immature Granulocytes: 0.04 10*3/uL (ref 0.00–0.07)
Basophils Absolute: 0.1 10*3/uL (ref 0.0–0.1)
Basophils Relative: 1 %
Eosinophils Absolute: 0.1 10*3/uL (ref 0.0–0.5)
Eosinophils Relative: 1 %
HCT: 34.1 % — ABNORMAL LOW (ref 36.0–46.0)
Hemoglobin: 10.8 g/dL — ABNORMAL LOW (ref 12.0–15.0)
Immature Granulocytes: 1 %
Lymphocytes Relative: 31 %
Lymphs Abs: 2.4 10*3/uL (ref 0.7–4.0)
MCH: 27.9 pg (ref 26.0–34.0)
MCHC: 31.7 g/dL (ref 30.0–36.0)
MCV: 88.1 fL (ref 80.0–100.0)
Monocytes Absolute: 0.5 10*3/uL (ref 0.1–1.0)
Monocytes Relative: 6 %
Neutro Abs: 4.7 10*3/uL (ref 1.7–7.7)
Neutrophils Relative %: 60 %
Platelets: 302 10*3/uL (ref 150–400)
RBC: 3.87 MIL/uL (ref 3.87–5.11)
RDW: 14.2 % (ref 11.5–15.5)
WBC: 7.8 10*3/uL (ref 4.0–10.5)
nRBC: 0 % (ref 0.0–0.2)

## 2022-07-02 LAB — PLATELET FUNCTION ASSAY: Collagen / Epinephrine: 146 seconds (ref 0–193)

## 2022-07-02 LAB — APTT: aPTT: 30 seconds (ref 24–36)

## 2022-07-02 LAB — PROTIME-INR
INR: 1 (ref 0.8–1.2)
Prothrombin Time: 13.8 seconds (ref 11.4–15.2)

## 2022-07-03 NOTE — Progress Notes (Signed)
[facility]  [phone] [phone]  ID: Robin Hoffman OB: [birth year]  MR#: [id number]  AOZ#:[id number]  Patient Care Team: Marcello Fennel, FNP as PCP - General (Family Medicine)  CHIEF COMPLAINT: Easy bruising.  INTERVAL HISTORY: Patient is a 47 year old female is being evaluated for chronic venous insufficiency by vascular surgery and is referred for easy bruising.  She otherwise feels well.  She has no neurologic complaints.  She denies any recent fevers or illnesses.  She has no new medications.  She has a good appetite and denies weight loss.  She has no chest pain, shortness of breath, cough, or hemoptysis.  She denies any nausea, vomiting, constipation, or diarrhea.  She has no urinary complaints.  Patient offers no further specific complaints today.  REVIEW OF SYSTEMS:   Review of Systems  Constitutional: Negative.  Negative for fever, malaise/fatigue and weight loss.  Respiratory: Negative.  Negative for cough, hemoptysis and shortness of breath.   Cardiovascular: Negative.  Negative for chest pain and leg swelling.  Gastrointestinal: Negative.  Negative for abdominal pain.  Genitourinary: Negative.  Negative for dysuria and hematuria.  Musculoskeletal: Negative.  Negative for back pain.  Skin: Negative.  Negative for rash.  Neurological: Negative.  Negative for dizziness, focal weakness, weakness and headaches.  Endo/Heme/Allergies:  Bruises/bleeds easily.  Psychiatric/Behavioral: Negative.  The patient is not nervous/anxious.     As per HPI. Otherwise, a complete review of systems is negative.  PAST MEDICAL HISTORY: Past Medical History:  Diagnosis Date   Arthritis    Asthma    Fibroids    Hyperlipidemia    Migraines    Vertigo     PAST SURGICAL HISTORY: Past Surgical History:  Procedure Laterality Date   CESAREAN SECTION     4   KNEE ARTHROSCOPY Right     FAMILY HISTORY: Family History  Problem Relation  Age of Onset   Hypertension Mother    Cancer Mother    Thyroid disease Mother    Anemia Mother    Stroke Father    Hyperlipidemia Father    Hypertension Father    Lung disease Father    Cataracts Father    Heart attack Father     ADVANCED DIRECTIVES (Y/N):  N  HEALTH MAINTENANCE: Social History   Tobacco Use   Smoking status: Never   Smokeless tobacco: Never  Vaping Use   Vaping Use: Never used  Substance Use Topics   Alcohol use: Never   Drug use: Never     Colonoscopy:  PAP:  Bone density:  Lipid panel:  Allergies  Allergen Reactions   Hydrocodone Nausea Only    Headache/dizziness also Headache/dizziness also Headache/dizziness also    Tramadol Nausea Only    Headache and dizziness as well Headache and dizziness as well Headache and dizziness as well    Codeine Nausea Only    Headache and dizziness as well Headache and dizziness as well Headache and dizziness as well    Penicillins     Other reaction(s): Unknown Other reaction(s): UNKNOWN In childhood records/reaction unknown Was in her childhood records-no known reaction Other reaction(s): UNKNOWN     Current Outpatient Medications  Medication Sig Dispense Refill   albuterol (PROVENTIL) (2.5 MG/3ML) 0.083% nebulizer solution Take 2.5 mg by nebulization 3 (three) times daily.     benzonatate (TESSALON) 200 MG capsule Take 200 mg by mouth 3 (three) times daily as needed.     budesonide-formoterol (SYMBICORT) 80-4.5 MCG/ACT inhaler Inhale into the lungs.  Cholecalciferol (D 1000) 25 MCG (1000 UT) capsule Take 1 capsule by mouth daily.     EPINEPHrine 0.3 mg/0.3 mL IJ SOAJ injection Inject 0.3 mg into the muscle as needed.     ferrous sulfate 325 (65 FE) MG EC tablet Take 325 mg by mouth 3 (three) times daily with meals.     furosemide (LASIX) 20 MG tablet Take 1 tablet (20 mg total) by mouth as needed for edema. No more than one dose in a 24 hour period.  Do not take more than 2 days in a row 30  tablet 0   montelukast (SINGULAIR) 10 MG tablet Take 10 mg by mouth daily as needed.     omeprazole (PRILOSEC) 20 MG capsule Take 20 mg by mouth as needed.     potassium chloride (KLOR-CON 10) 10 MEQ tablet Take 1 tablet by mouth daily.     meloxicam (MOBIC) 15 MG tablet Take 15 mg by mouth daily. (Patient not taking: Reported on 06/22/2022)     No current facility-administered medications for this visit.    OBJECTIVE: Vitals:   07/02/22 1522 07/02/22 1523  BP: (!) 134/91 127/80  Pulse: 71   Temp: 98.7 F (37.1 C)   SpO2: 98%      Body mass index is 60.68 kg/m.    ECOG FS:0 - Asymptomatic  General: Well-developed, well-nourished, no acute distress. Eyes: Pink conjunctiva, anicteric sclera. HEENT: Normocephalic, moist mucous membranes. Lungs: No audible wheezing or coughing. Heart: Regular rate and rhythm. Abdomen: Soft, nontender, no obvious distention. Musculoskeletal: No edema, cyanosis, or clubbing. Neuro: Alert, answering all questions appropriately. Cranial nerves grossly intact. Skin: No rashes or petechiae noted. Psych: Normal affect. Lymphatics: No cervical, calvicular, axillary or inguinal LAD.   LAB RESULTS:  No results found for: "NA", "K", "CL", "CO2", "GLUCOSE", "BUN", "CREATININE", "CALCIUM", "PROT", "ALBUMIN", "AST", "ALT", "ALKPHOS", "BILITOT", "GFRNONAA", "GFRAA"  Lab Results  Component Value Date   WBC 7.8 07/02/2022   NEUTROABS 4.7 07/02/2022   HGB 10.8 (L) 07/02/2022   HCT 34.1 (L) 07/02/2022   MCV 88.1 07/02/2022   PLT 302 07/02/2022     STUDIES: VAS [place] LOWER EXTREMITY VENOUS REFLUX  Result Date: 07/02/2022  Lower Venous Reflux Study Patient Name:  Robin Hoffman  Date of Exam:   07/01/2022 Medical Rec #: [id number]                 Accession #:    [id number] Date of Birth: November 02, 1975                 Patient Gender: F Patient Age:   64 years Exam Location:  [facility] Procedure:      VAS [place] LOWER EXTREMITY VENOUS REFLUX  Referring Phys: Levora Dredge --------------------------------------------------------------------------------  Indications: Pain, Swelling, and Edema.  Performing Technologist: Hardie Lora RVT  Examination Guidelines: A complete evaluation includes B-mode imaging, spectral Doppler, color Doppler, and power Doppler as needed of all accessible portions of each vessel. Bilateral testing is considered an integral part of a complete examination. Limited examinations for reoccurring indications may be performed as noted. The reflux portion of the exam is performed with the patient in reverse Trendelenburg. Significant venous reflux is defined as >500 ms in the superficial venous system, and >1 second in the deep venous system.  +--------------+---------+------+-----------+------------+--------+ LEFT          Reflux NoRefluxReflux TimeDiameter cmsComments  Yes                                  +--------------+---------+------+-----------+------------+--------+ CFV           no                                             +--------------+---------+------+-----------+------------+--------+ FV prox       no                                             +--------------+---------+------+-----------+------------+--------+ FV mid        no                                             +--------------+---------+------+-----------+------------+--------+ FV dist       no                                             +--------------+---------+------+-----------+------------+--------+ Popliteal     no                                             +--------------+---------+------+-----------+------------+--------+ GSV at SFJ              yes    >500 ms      0.67             +--------------+---------+------+-----------+------------+--------+ GSV prox thighno                            0.54              +--------------+---------+------+-----------+------------+--------+ GSV mid thigh no                            0.62             +--------------+---------+------+-----------+------------+--------+ GSV dist thighno                            0.47             +--------------+---------+------+-----------+------------+--------+ GSV at knee   no                            0.47             +--------------+---------+------+-----------+------------+--------+ GSV prox calf no                            0.43             +--------------+---------+------+-----------+------------+--------+ SSV Pop Fossa no  0.28             +--------------+---------+------+-----------+------------+--------+ SSV prox calf no                            0.29             +--------------+---------+------+-----------+------------+--------+ SSV mid calf  no                            0.36             +--------------+---------+------+-----------+------------+--------+   Summary: Left: - No evidence of deep vein thrombosis seen in the left lower extremity, from the common femoral through the popliteal veins. - No evidence of superficial venous thrombosis in the left lower extremity. - No evidence of superficial venous reflux seen in the left short saphenous vein. - Venous reflux is noted in the left sapheno-femoral junction.  *See table(s) above for measurements and observations. Electronically signed by Levora Dredge MD on 07/02/2022 at 11:30:09 AM.    Final     ASSESSMENT: Easy bruising.  PLAN:    Easy bruising: Unclear etiology.  Patient has a normal platelet count as well as a normal PFA.  PT, INR, and PTT are all also within normal limits.  Von Willebrand panel was ordered for completeness and is pending at time of dictation.  No intervention is needed at this time.  Patient will have video-assisted telemedicine visit in approximately 3 weeks for further evaluation and  discussion of her laboratory results. Venous insufficiency: Continue follow-up with vascular surgery as scheduled.  I spent a total of 45 minutes reviewing chart data, face-to-face evaluation with the patient, counseling and coordination of care as detailed above.   Patient expressed understanding and was in agreement with this plan. She also understands that She can call clinic at any time with any questions, concerns, or complaints.     Robin Ruths, MD   07/03/2022 1:23 PM

## 2022-07-04 LAB — VON WILLEBRAND PANEL
Coagulation Factor VIII: 178 % — ABNORMAL HIGH (ref 56–140)
Ristocetin Co-factor, Plasma: 115 % (ref 50–200)
Von Willebrand Antigen, Plasma: 187 % (ref 50–200)

## 2022-07-04 LAB — COAG STUDIES INTERP REPORT

## 2022-07-09 ENCOUNTER — Ambulatory Visit (INDEPENDENT_AMBULATORY_CARE_PROVIDER_SITE_OTHER): Payer: BC Managed Care – PPO | Admitting: Vascular Surgery

## 2022-07-14 ENCOUNTER — Encounter: Payer: Self-pay | Admitting: Oncology

## 2022-07-14 ENCOUNTER — Inpatient Hospital Stay: Payer: BC Managed Care – PPO | Admitting: Oncology

## 2022-07-15 NOTE — Progress Notes (Signed)
This encounter was created in error - please disregard.

## 2022-07-16 ENCOUNTER — Inpatient Hospital Stay: Payer: BC Managed Care – PPO | Attending: Oncology | Admitting: Oncology

## 2022-07-16 DIAGNOSIS — R233 Spontaneous ecchymoses: Secondary | ICD-10-CM | POA: Insufficient documentation

## 2022-07-16 DIAGNOSIS — I872 Venous insufficiency (chronic) (peripheral): Secondary | ICD-10-CM | POA: Insufficient documentation

## 2022-07-16 DIAGNOSIS — D649 Anemia, unspecified: Secondary | ICD-10-CM | POA: Insufficient documentation

## 2022-07-16 NOTE — Progress Notes (Signed)
Tried calling pt to go over appt questions, no answer, left message on vm.

## 2022-07-17 NOTE — Progress Notes (Signed)
This encounter was created in error - please disregard.

## 2022-07-23 ENCOUNTER — Encounter: Payer: Self-pay | Admitting: Oncology

## 2022-07-23 ENCOUNTER — Inpatient Hospital Stay: Payer: BC Managed Care – PPO | Admitting: Oncology

## 2022-07-23 ENCOUNTER — Ambulatory Visit (INDEPENDENT_AMBULATORY_CARE_PROVIDER_SITE_OTHER): Payer: BC Managed Care – PPO | Admitting: Vascular Surgery

## 2022-07-23 ENCOUNTER — Encounter (INDEPENDENT_AMBULATORY_CARE_PROVIDER_SITE_OTHER): Payer: Self-pay | Admitting: Vascular Surgery

## 2022-07-23 ENCOUNTER — Telehealth: Payer: Self-pay

## 2022-07-23 VITALS — BP 119/86 | HR 54 | Temp 97.4°F | Resp 18 | Ht 60.0 in | Wt 305.3 lb

## 2022-07-23 VITALS — BP 136/76 | HR 73 | Resp 18 | Ht 60.0 in | Wt 306.6 lb

## 2022-07-23 DIAGNOSIS — Z6841 Body Mass Index (BMI) 40.0 and over, adult: Secondary | ICD-10-CM

## 2022-07-23 DIAGNOSIS — R233 Spontaneous ecchymoses: Secondary | ICD-10-CM | POA: Diagnosis present

## 2022-07-23 DIAGNOSIS — I872 Venous insufficiency (chronic) (peripheral): Secondary | ICD-10-CM

## 2022-07-23 DIAGNOSIS — J45909 Unspecified asthma, uncomplicated: Secondary | ICD-10-CM | POA: Diagnosis not present

## 2022-07-23 DIAGNOSIS — D649 Anemia, unspecified: Secondary | ICD-10-CM | POA: Diagnosis not present

## 2022-07-23 DIAGNOSIS — I890 Lymphedema, not elsewhere classified: Secondary | ICD-10-CM | POA: Diagnosis not present

## 2022-07-23 NOTE — Progress Notes (Signed)
MRN : [id number]  Robin Hoffman is a 47 y.o. (April 29, 1975) female who presents with chief complaint of legs swell.  History of Present Illness:    The patient returns to the office for followup evaluation regarding leg swelling.  The swelling has persisted but with the lymph pump the patient states the swelling is much better controlled. The pain associated with swelling is essentially eliminated. There have not been any interval development of a ulcerations or wounds.  No episodes of cellulitis or infection over the past 12 months  The patient denies problems with the pump, noting it is working well and the leggings are in good condition.  Since the previous visit the patient has been wearing graduated compression stockings and using the lymph pump on a routine basis and  has noted significant improvement in the lymphedema.   Patient stated the lymph pump has been helpful in treating the lymphedema.  No other major changes in her overall health care.  However she is growing increasingly concerned regarding her weight and is inquiring about possible evaluation for bariatric surgery.  She is requested [facility].      No outpatient medications have been marked as taking for the 07/23/22 encounter (Appointment) with Gilda Crease, Latina Craver, MD.    Past Medical History:  Diagnosis Date   Arthritis    Asthma    Fibroids    Hyperlipidemia    Migraines    Vertigo     Past Surgical History:  Procedure Laterality Date   CESAREAN SECTION     4   KNEE ARTHROSCOPY Right     Social History Social History   Tobacco Use   Smoking status: Never   Smokeless tobacco: Never  Vaping Use   Vaping Use: Never used  Substance Use Topics   Alcohol use: Never   Drug use: Never    Family History Family History  Problem Relation Age of Onset   Hypertension Mother    Cancer Mother    Thyroid disease Mother    Anemia Mother    Stroke Father     Hyperlipidemia Father    Hypertension Father    Lung disease Father    Cataracts Father    Heart attack Father     Allergies  Allergen Reactions   Hydrocodone Nausea Only    Headache/dizziness also Headache/dizziness also Headache/dizziness also    Tramadol Nausea Only    Headache and dizziness as well Headache and dizziness as well Headache and dizziness as well    Codeine Nausea Only    Headache and dizziness as well Headache and dizziness as well Headache and dizziness as well    Penicillins     Other reaction(s): Unknown Other reaction(s): UNKNOWN In childhood records/reaction unknown Was in her childhood records-no known reaction Other reaction(s): UNKNOWN      REVIEW OF SYSTEMS (Negative unless checked)  Constitutional: [] Weight loss  [] Fever  [] Chills Cardiac: [] Chest pain   [] Chest pressure   [] Palpitations   [] Shortness of breath when laying flat   [] Shortness of breath with exertion. Vascular:  [] Pain in legs with walking   [x] Pain in legs with standing  [] History of DVT   [] Phlebitis   [x] Swelling in legs   [] Varicose veins   [] Non-healing ulcers Pulmonary:   [] Uses home oxygen   [] Productive cough   [] Hemoptysis   []   Wheeze  [] COPD   [] Asthma Neurologic:  [] Dizziness   [] Seizures   [] History of stroke   [] History of TIA  [] Aphasia   [] Vissual changes   [] Weakness or numbness in arm   [] Weakness or numbness in leg Musculoskeletal:   [] Joint swelling   [] Joint pain   [] Low back pain Hematologic:  [] Easy bruising  [] Easy bleeding   [] Hypercoagulable state   [] Anemic Gastrointestinal:  [] Diarrhea   [] Vomiting  [] Gastroesophageal reflux/heartburn   [] Difficulty swallowing. Genitourinary:  [] Chronic kidney disease   [] Difficult urination  [] Frequent urination   [] Blood in urine Skin:  [] Rashes   [] Ulcers  Psychological:  [] History of anxiety   []  History of major depression.  Physical Examination  There were no vitals filed for this visit. There is no height or  weight on file to calculate BMI. Gen: WD/WN, NAD Head: [place]/AT, No temporalis wasting.  Ear/Nose/Throat: Hearing grossly intact, nares w/o erythema or drainage, pinna without lesions Eyes: PER, EOMI, sclera nonicteric.  Neck: Supple, no gross masses.  No JVD.  Pulmonary:  Good air movement, no audible wheezing, no use of accessory muscles.  Cardiac: RRR, precordium not hyperdynamic. Vascular:  scattered varicosities present bilaterally.  Mild venous stasis changes to the legs bilaterally.  3+ soft pitting edema, CEAP [id number]  Vessel Right Left  Radial Palpable Palpable  Gastrointestinal: soft, non-distended. No guarding/no peritoneal signs.  Musculoskeletal: M/S 5/5 throughout.  No deformity.  Neurologic: CN 2-12 intact. Pain and light touch intact in extremities.  Symmetrical.  Speech is fluent. Motor exam as listed above. Psychiatric: Judgment intact, Mood & affect appropriate for pt's clinical situation. Dermatologic: Venous rashes no ulcers noted.  No changes consistent with cellulitis. Lymph : No lichenification or skin changes of chronic lymphedema.  CBC Lab Results  Component Value Date   WBC 7.8 07/02/2022   HGB 10.8 (L) 07/02/2022   HCT 34.1 (L) 07/02/2022   MCV 88.1 07/02/2022   PLT 302 07/02/2022    BMET No results found for: "NA", "K", "CL", "CO2", "GLUCOSE", "BUN", "CREATININE", "CALCIUM", "GFRNONAA", "GFRAA" CrCl cannot be calculated (No successful lab value found.).  COAG Lab Results  Component Value Date   INR 1.0 07/02/2022    Radiology VAS [place] LOWER EXTREMITY VENOUS REFLUX  Result Date: 07/02/2022  Lower Venous Reflux Study Patient Name:  Robin Hoffman  Date of Exam:   07/01/2022 Medical Rec #: [id number]                 Accession #:    [id number] Date of Birth: 01/23/1976                 Patient Gender: F Patient Age:   64 years Exam Location:  [facility] Procedure:      VAS [place] LOWER EXTREMITY VENOUS REFLUX Referring Phys: Levora Dredge --------------------------------------------------------------------------------  Indications: Pain, Swelling, and Edema.  Performing Technologist: Hardie Lora RVT  Examination Guidelines: A complete evaluation includes B-mode imaging, spectral Doppler, color Doppler, and power Doppler as needed of all accessible portions of each vessel. Bilateral testing is considered an integral part of a complete examination. Limited examinations for reoccurring indications may be performed as noted. The reflux portion of the exam is performed with the patient in reverse Trendelenburg. Significant venous reflux is defined as >500 ms in the superficial venous system, and >1 second in the deep venous system.  +--------------+---------+------+-----------+------------+--------+ LEFT          Reflux NoRefluxReflux TimeDiameter cmsComments  Yes                                  +--------------+---------+------+-----------+------------+--------+ CFV           no                                             +--------------+---------+------+-----------+------------+--------+ FV prox       no                                             +--------------+---------+------+-----------+------------+--------+ FV mid        no                                             +--------------+---------+------+-----------+------------+--------+ FV dist       no                                             +--------------+---------+------+-----------+------------+--------+ Popliteal     no                                             +--------------+---------+------+-----------+------------+--------+ GSV at SFJ              yes    >500 ms      0.67             +--------------+---------+------+-----------+------------+--------+ GSV prox thighno                            0.54             +--------------+---------+------+-----------+------------+--------+ GSV mid  thigh no                            0.62             +--------------+---------+------+-----------+------------+--------+ GSV dist thighno                            0.47             +--------------+---------+------+-----------+------------+--------+ GSV at knee   no                            0.47             +--------------+---------+------+-----------+------------+--------+ GSV prox calf no                            0.43             +--------------+---------+------+-----------+------------+--------+ SSV Pop Fossa no  0.28             +--------------+---------+------+-----------+------------+--------+ SSV prox calf no                            0.29             +--------------+---------+------+-----------+------------+--------+ SSV mid calf  no                            0.36             +--------------+---------+------+-----------+------------+--------+   Summary: Left: - No evidence of deep vein thrombosis seen in the left lower extremity, from the common femoral through the popliteal veins. - No evidence of superficial venous thrombosis in the left lower extremity. - No evidence of superficial venous reflux seen in the left short saphenous vein. - Venous reflux is noted in the left sapheno-femoral junction.  *See table(s) above for measurements and observations. Electronically signed by Levora Dredge MD on 07/02/2022 at 11:30:09 AM.    Final      Assessment/Plan 1. Lymphedema Recommend:  No surgery or intervention at this point in time.    I have reviewed my discussion with the patient regarding lymphedema and why it  causes symptoms.  Patient will continue wearing graduated compression on a daily basis. The patient should put the compression on first thing in the morning and removing them in the evening. The patient should not sleep in the compression.   In addition, behavioral modification throughout the day will be  continued.  This will include frequent elevation (such as in a recliner), use of over the counter pain medications as needed and exercise such as walking.  The systemic causes for chronic edema such as liver, kidney and cardiac etiologies does not appear to have significant changed over the past year.    The patient will continue aggressive use of the  lymph pump.  This will continue to improve the edema control and prevent sequela such as ulcers and infections.   The patient will follow-up with me on an annual basis.   2. Chronic venous insufficiency Recommend:  No surgery or intervention at this point in time.    I have reviewed my discussion with the patient regarding lymphedema and why it  causes symptoms.  Patient will continue wearing graduated compression on a daily basis. The patient should put the compression on first thing in the morning and removing them in the evening. The patient should not sleep in the compression.   In addition, behavioral modification throughout the day will be continued.  This will include frequent elevation (such as in a recliner), use of over the counter pain medications as needed and exercise such as walking.  The systemic causes for chronic edema such as liver, kidney and cardiac etiologies does not appear to have significant changed over the past year.    The patient will continue aggressive use of the  lymph pump.  This will continue to improve the edema control and prevent sequela such as ulcers and infections.   The patient will follow-up with me on an annual basis.   3. Uncomplicated asthma, unspecified asthma severity, unspecified whether persistent Continue pulmonary medications and aerosols as already ordered, these medications have been reviewed and there are no changes at this time.   4. Morbid obesity with BMI of 50.0-59.9, adult (HCC) In keeping with the patient's request I have placed  a ambulatory referral to [facility] with a request for  bariatric evaluation.  I do believe that her obesity is having a profoundly significant impact on her leg pains and general disability. - Ambulatory referral to General Surgery    Levora Dredge, MD  07/23/2022 2:57 PM

## 2022-07-23 NOTE — Progress Notes (Signed)
[facility]  [phone] Fax:([phone]  ID: Robin Hoffman [place] OB: 05-01-1975  MR#: [id number]  GEX#:[id number]  Patient Care Team: Marcello Fennel, FNP as PCP - General (Family Medicine)  CHIEF COMPLAINT: Easy bruising.  INTERVAL HISTORY: Patient returns to clinic today for further evaluation and discussion of her laboratory results.  She continues to have occasional bruising, but otherwise feels well.  She has no neurologic complaints.  She denies any recent fevers or illnesses.  She has a good appetite and denies weight loss.  She has no chest pain, shortness of breath, cough, or hemoptysis.  She denies any nausea, vomiting, constipation, or diarrhea.  She has no urinary complaints.  Patient offers no further specific complaints today.  REVIEW OF SYSTEMS:   Review of Systems  Constitutional: Negative.  Negative for fever, malaise/fatigue and weight loss.  Respiratory: Negative.  Negative for cough, hemoptysis and shortness of breath.   Cardiovascular: Negative.  Negative for chest pain and leg swelling.  Gastrointestinal: Negative.  Negative for abdominal pain.  Genitourinary: Negative.  Negative for dysuria and hematuria.  Musculoskeletal: Negative.  Negative for back pain.  Skin: Negative.  Negative for rash.  Neurological: Negative.  Negative for dizziness, focal weakness, weakness and headaches.  Endo/Heme/Allergies:  Bruises/bleeds easily.  Psychiatric/Behavioral: Negative.  The patient is not nervous/anxious.     As per HPI. Otherwise, a complete review of systems is negative.  PAST MEDICAL HISTORY: Past Medical History:  Diagnosis Date   Arthritis    Asthma    Fibroids    Hyperlipidemia    Migraines    Vertigo     PAST SURGICAL HISTORY: Past Surgical History:  Procedure Laterality Date   CESAREAN SECTION     4   KNEE ARTHROSCOPY Right     FAMILY HISTORY: Family History  Problem Relation Age of Onset   Hypertension  Mother    Cancer Mother    Thyroid disease Mother    Anemia Mother    Stroke Father    Hyperlipidemia Father    Hypertension Father    Lung disease Father    Cataracts Father    Heart attack Father     ADVANCED DIRECTIVES (Y/N):  N  HEALTH MAINTENANCE: Social History   Tobacco Use   Smoking status: Never   Smokeless tobacco: Never  Vaping Use   Vaping Use: Never used  Substance Use Topics   Alcohol use: Never   Drug use: Never     Colonoscopy:  PAP:  Bone density:  Lipid panel:  Allergies  Allergen Reactions   Hydrocodone Nausea Only    Headache/dizziness also Headache/dizziness also Headache/dizziness also    Tramadol Nausea Only    Headache and dizziness as well Headache and dizziness as well Headache and dizziness as well    Codeine Nausea Only    Headache and dizziness as well Headache and dizziness as well Headache and dizziness as well    Penicillins     Other reaction(s): Unknown Other reaction(s): UNKNOWN In childhood records/reaction unknown Was in her childhood records-no known reaction Other reaction(s): UNKNOWN     Current Outpatient Medications  Medication Sig Dispense Refill   albuterol (PROVENTIL) (2.5 MG/3ML) 0.083% nebulizer solution Take 2.5 mg by nebulization 3 (three) times daily.     benzonatate (TESSALON) 200 MG capsule Take 200 mg by mouth 3 (three) times daily as needed.     budesonide-formoterol (SYMBICORT) 80-4.5 MCG/ACT inhaler Inhale into the lungs.     Cholecalciferol (D 1000)  25 MCG (1000 UT) capsule Take 1 capsule by mouth daily.     EPINEPHrine 0.3 mg/0.3 mL IJ SOAJ injection Inject 0.3 mg into the muscle as needed.     ferrous sulfate 325 (65 FE) MG EC tablet Take 325 mg by mouth 3 (three) times daily with meals.     furosemide (LASIX) 20 MG tablet Take 1 tablet (20 mg total) by mouth as needed for edema. No more than one dose in a 24 hour period.  Do not take more than 2 days in a row 30 tablet 0   meloxicam (MOBIC)  15 MG tablet Take 15 mg by mouth daily.     montelukast (SINGULAIR) 10 MG tablet Take 10 mg by mouth daily as needed.     omeprazole (PRILOSEC) 20 MG capsule Take 20 mg by mouth as needed.     potassium chloride (KLOR-CON 10) 10 MEQ tablet Take 1 tablet by mouth daily.     No current facility-administered medications for this visit.    OBJECTIVE: Vitals:   07/23/22 0945  BP: 119/86  Pulse: (!) 54  Resp: 18  Temp: (!) 97.4 F (36.3 C)  SpO2: 100%     Body mass index is 59.62 kg/m.    ECOG FS:0 - Asymptomatic  General: Well-developed, well-nourished, no acute distress. Eyes: Pink conjunctiva, anicteric sclera. HEENT: Normocephalic, moist mucous membranes. Lungs: No audible wheezing or coughing. Heart: Regular rate and rhythm. Abdomen: Soft, nontender, no obvious distention. Musculoskeletal: No edema, cyanosis, or clubbing. Neuro: Alert, answering all questions appropriately. Cranial nerves grossly intact. Skin: No rashes or petechiae noted. Psych: Normal affect.  LAB RESULTS:  No results found for: "NA", "K", "CL", "CO2", "GLUCOSE", "BUN", "CREATININE", "CALCIUM", "PROT", "ALBUMIN", "AST", "ALT", "ALKPHOS", "BILITOT", "GFRNONAA", "GFRAA"  Lab Results  Component Value Date   WBC 7.8 07/02/2022   NEUTROABS 4.7 07/02/2022   HGB 10.8 (L) 07/02/2022   HCT 34.1 (L) 07/02/2022   MCV 88.1 07/02/2022   PLT 302 07/02/2022     STUDIES: VAS [place] LOWER EXTREMITY VENOUS REFLUX  Result Date: 07/02/2022  Lower Venous Reflux Study Patient Name:  Robin Hoffman  Date of Exam:   07/01/2022 Medical Rec #: [id number]                 Accession #:    [id number] Date of Birth: 18-Mar-1975                 Patient Gender: F Patient Age:   47 years Exam Location:  [facility] Procedure:      VAS [place] LOWER EXTREMITY VENOUS REFLUX Referring Phys: Levora Dredge --------------------------------------------------------------------------------  Indications: Pain, Swelling, and Edema.   Performing Technologist: Hardie Lora RVT  Examination Guidelines: A complete evaluation includes B-mode imaging, spectral Doppler, color Doppler, and power Doppler as needed of all accessible portions of each vessel. Bilateral testing is considered an integral part of a complete examination. Limited examinations for reoccurring indications may be performed as noted. The reflux portion of the exam is performed with the patient in reverse Trendelenburg. Significant venous reflux is defined as >500 ms in the superficial venous system, and >1 second in the deep venous system.  +--------------+---------+------+-----------+------------+--------+ LEFT          Reflux NoRefluxReflux TimeDiameter cmsComments                         Yes                                  +--------------+---------+------+-----------+------------+--------+  CFV           no                                             +--------------+---------+------+-----------+------------+--------+ FV prox       no                                             +--------------+---------+------+-----------+------------+--------+ FV mid        no                                             +--------------+---------+------+-----------+------------+--------+ FV dist       no                                             +--------------+---------+------+-----------+------------+--------+ Popliteal     no                                             +--------------+---------+------+-----------+------------+--------+ GSV at SFJ              yes    >500 ms      0.67             +--------------+---------+------+-----------+------------+--------+ GSV prox thighno                            0.54             +--------------+---------+------+-----------+------------+--------+ GSV mid thigh no                            0.62             +--------------+---------+------+-----------+------------+--------+ GSV  dist thighno                            0.47             +--------------+---------+------+-----------+------------+--------+ GSV at knee   no                            0.47             +--------------+---------+------+-----------+------------+--------+ GSV prox calf no                            0.43             +--------------+---------+------+-----------+------------+--------+ SSV Pop Fossa no                            0.28             +--------------+---------+------+-----------+------------+--------+ SSV prox calf no  0.29             +--------------+---------+------+-----------+------------+--------+ SSV mid calf  no                            0.36             +--------------+---------+------+-----------+------------+--------+   Summary: Left: - No evidence of deep vein thrombosis seen in the left lower extremity, from the common femoral through the popliteal veins. - No evidence of superficial venous thrombosis in the left lower extremity. - No evidence of superficial venous reflux seen in the left short saphenous vein. - Venous reflux is noted in the left sapheno-femoral junction.  *See table(s) above for measurements and observations. Electronically signed by Levora Dredge MD on 07/02/2022 at 11:30:09 AM.    Final     ASSESSMENT: Easy bruising.  PLAN:    Easy bruising: Patient has no evidence of bleeding diathesis.  Patient has a normal platelet count as well as a normal PFA.  PT, INR, and PTT are all also within normal limits.  Von Willebrand panel was negative.  No intervention is needed at this time.  No intervention is needed at this time.  No further follow-up has been scheduled.   Anemia: Mild, monitor.  Continue oral iron supplementation.   Venous insufficiency: Continue follow-up with vascular surgery as scheduled.  I spent a total of 20 minutes reviewing chart data, face-to-face evaluation with the patient, counseling  and coordination of care as detailed above.    Patient expressed understanding and was in agreement with this plan. She also understands that She can call clinic at any time with any questions, concerns, or complaints.     Jeralyn Ruths, MD   07/23/2022 10:01 AM

## 2022-07-23 NOTE — Telephone Encounter (Signed)
Clearance letter written and faxed to Robin Hoffman, DDS in [place]. It has been faxed to the email listed [email or web address]

## 2022-07-25 ENCOUNTER — Encounter (INDEPENDENT_AMBULATORY_CARE_PROVIDER_SITE_OTHER): Payer: Self-pay | Admitting: Vascular Surgery

## 2023-01-25 ENCOUNTER — Ambulatory Visit (INDEPENDENT_AMBULATORY_CARE_PROVIDER_SITE_OTHER): Payer: BC Managed Care – PPO | Admitting: Vascular Surgery

## 2023-04-29 ENCOUNTER — Ambulatory Visit (INDEPENDENT_AMBULATORY_CARE_PROVIDER_SITE_OTHER): Payer: BC Managed Care – PPO | Admitting: Vascular Surgery

## 2023-05-24 ENCOUNTER — Encounter (INDEPENDENT_AMBULATORY_CARE_PROVIDER_SITE_OTHER): Payer: Self-pay | Admitting: Vascular Surgery

## 2023-05-24 ENCOUNTER — Ambulatory Visit (INDEPENDENT_AMBULATORY_CARE_PROVIDER_SITE_OTHER): Admitting: Vascular Surgery

## 2023-05-24 VITALS — BP 138/80 | HR 62 | Resp 18 | Ht 60.0 in | Wt 286.0 lb

## 2023-05-24 DIAGNOSIS — M150 Primary generalized (osteo)arthritis: Secondary | ICD-10-CM | POA: Diagnosis not present

## 2023-05-24 DIAGNOSIS — I890 Lymphedema, not elsewhere classified: Secondary | ICD-10-CM | POA: Diagnosis not present

## 2023-05-24 DIAGNOSIS — M1990 Unspecified osteoarthritis, unspecified site: Secondary | ICD-10-CM | POA: Insufficient documentation

## 2023-05-24 DIAGNOSIS — M79606 Pain in leg, unspecified: Secondary | ICD-10-CM

## 2023-05-24 DIAGNOSIS — J45909 Unspecified asthma, uncomplicated: Secondary | ICD-10-CM

## 2023-05-24 NOTE — Progress Notes (Signed)
 MRN : [id number]  Robin Hoffman is a 48 y.o. (05/09/1975) female who presents with chief complaint of legs swell.  History of Present Illness:   The patient returns to the office for followup evaluation regarding leg swelling.  The swelling has persisted and the pain associated with swelling continues.  In fact, she notes the pain is significantly worse.  It occurs with standing somewhat with sitting and with lying down.  She notes that in the morning she is in severe pain and it is difficult to get going.  There have not been any interval development of a ulcerations or wounds.  Since the previous visit the patient has been wearing graduated compression stockings and has noted little if any improvement in the lymphedema.  Also she does have a lymph pump but finds it to be extremely painful to wear so she has not been able to use it.  The patient also states elevation during the day and exercise is being done too.  No other major changes in her overall health care.  However she is growing increasingly concerned regarding her weight and is inquiring about possible evaluation for bariatric surgery.  She is requested [facility].  Current Meds  Medication Sig   albuterol (PROVENTIL) (2.5 MG/3ML) 0.083% nebulizer solution Take 2.5 mg by nebulization 3 (three) times daily.   benzonatate (TESSALON) 200 MG capsule Take 200 mg by mouth 3 (three) times daily as needed.   budesonide-formoterol (SYMBICORT) 80-4.5 MCG/ACT inhaler Inhale into the lungs.   Cholecalciferol (D 1000) 25 MCG (1000 UT) capsule Take 1 capsule by mouth daily.   docusate sodium (COLACE) 100 MG capsule Take 100 mg by mouth daily.   EPINEPHrine 0.3 mg/0.3 mL IJ SOAJ injection Inject 0.3 mg into the muscle as needed.   furosemide  (LASIX ) 20 MG tablet Take 1 tablet (20 mg total) by mouth as needed for edema. No more than one dose in a 24 hour period.  Do not take more than 2 days in a row    montelukast (SINGULAIR) 10 MG tablet Take 10 mg by mouth daily as needed.   omeprazole (PRILOSEC) 20 MG capsule Take 20 mg by mouth as needed.   ondansetron (ZOFRAN) 4 MG tablet Take 4 mg by mouth every 4 (four) hours as needed.   potassium chloride (KLOR-CON 10) 10 MEQ tablet Take 1 tablet by mouth daily.    Past Medical History:  Diagnosis Date   Arthritis    Asthma    Fibroids    Hyperlipidemia    Lymphedema    Migraines    Vertigo     Past Surgical History:  Procedure Laterality Date   CESAREAN SECTION     4   KNEE ARTHROSCOPY Right     Social History Social History   Tobacco Use   Smoking status: Never   Smokeless tobacco: Never  Vaping Use   Vaping status: Never Used  Substance Use Topics   Alcohol use: Never   Drug use: Never    Family History Family History  Problem Relation Age of Onset   Hypertension Mother    Cancer Mother    Thyroid disease Mother    Anemia Mother    Stroke Father    Hyperlipidemia Father    Hypertension Father    Lung disease Father    Cataracts  Father    Heart attack Father     Allergies  Allergen Reactions   Hydrocodone Nausea Only    Headache/dizziness also Headache/dizziness also Headache/dizziness also    Tramadol Nausea Only    Headache and dizziness as well Headache and dizziness as well Headache and dizziness as well    Codeine Nausea Only    Headache and dizziness as well Headache and dizziness as well Headache and dizziness as well    Penicillins     Other reaction(s): Unknown Other reaction(s): UNKNOWN In childhood records/reaction unknown Was in her childhood records-no known reaction Other reaction(s): UNKNOWN      REVIEW OF SYSTEMS (Negative unless checked)  Constitutional: [] Weight loss  [] Fever  [] Chills Cardiac: [] Chest pain   [] Chest pressure   [] Palpitations   [] Shortness of breath when laying flat   [] Shortness of breath with exertion. Vascular:  [] Pain in legs with walking   [x] Pain  in legs with standing  [] History of DVT   [] Phlebitis   [x] Swelling in legs   [] Varicose veins   [] Non-healing ulcers Pulmonary:   [] Uses home oxygen   [] Productive cough   [] Hemoptysis   [] Wheeze  [] COPD   [] Asthma Neurologic:  [] Dizziness   [] Seizures   [] History of stroke   [] History of TIA  [] Aphasia   [] Vissual changes   [] Weakness or numbness in arm   [] Weakness or numbness in leg Musculoskeletal:   [] Joint swelling   [] Joint pain   [] Low back pain Hematologic:  [] Easy bruising  [] Easy bleeding   [] Hypercoagulable state   [] Anemic Gastrointestinal:  [] Diarrhea   [] Vomiting  [] Gastroesophageal reflux/heartburn   [] Difficulty swallowing. Genitourinary:  [] Chronic kidney disease   [] Difficult urination  [] Frequent urination   [] Blood in urine Skin:  [] Rashes   [] Ulcers  Psychological:  [] History of anxiety   []  History of major depression.  Physical Examination  Vitals:   05/24/23 0901  BP: 138/80  Pulse: 62  Resp: 18  Weight: 286 lb (129.7 kg)  Height: 5' (1.524 m)   Body mass index is 55.86 kg/m. Gen: WD/WN, NAD Head: [place]/AT, No temporalis wasting.  Ear/Nose/Throat: Hearing grossly intact, nares w/o erythema or drainage, pinna without lesions Eyes: PER, EOMI, sclera nonicteric.  Neck: Supple, no gross masses.  No JVD.  Pulmonary:  Good air movement, no audible wheezing, no use of accessory muscles.  Cardiac: RRR, precordium not hyperdynamic. Vascular:  scattered varicosities present bilaterally.  Mild venous stasis changes to the legs bilaterally.  2-3+ soft pitting edema, CEAP C4sEpAsPr  Vessel Right Left  Radial Palpable Palpable  Gastrointestinal: soft, non-distended. No guarding/no peritoneal signs.  Musculoskeletal: M/S 5/5 throughout.  No deformity.  Neurologic: CN 2-12 intact. Pain and light touch intact in extremities.  Symmetrical.  Speech is fluent. Motor exam as listed above. Psychiatric: Judgment intact, Mood & affect appropriate for pt's clinical  situation. Dermatologic: Venous rashes no ulcers noted.  No changes consistent with cellulitis. Lymph : No lichenification or skin changes of chronic lymphedema.  CBC Lab Results  Component Value Date   WBC 7.8 07/02/2022   HGB 10.8 (L) 07/02/2022   HCT 34.1 (L) 07/02/2022   MCV 88.1 07/02/2022   PLT 302 07/02/2022    BMET No results found for: "NA", "K", "CL", "CO2", "GLUCOSE", "BUN", "CREATININE", "CALCIUM", "GFRNONAA", "GFRAA" CrCl cannot be calculated (No successful lab value found.).  COAG Lab Results  Component Value Date   INR 1.0 07/02/2022    Radiology No results found.   Assessment/Plan 1. Lymphedema (  Primary) Recommend:  No surgery or intervention at this point in time.    I have reviewed my discussion with the patient regarding lymphedema and why it  causes symptoms.  Patient will continue wearing graduated compression on a daily basis. The patient should put the compression on first thing in the morning and removing them in the evening. The patient should not sleep in the compression.   In addition, behavioral modification throughout the day will be continued.  This will include frequent elevation (such as in a recliner), use of over the counter pain medications as needed and exercise such as walking.  I have strongly encouraged the patient to continue aggressive use of the  lymph pump.  This will continue to improve the edema control and prevent sequela such as ulcers and infections.   The patient will follow-up with me on an annual basis.   2. Pain of lower extremity, unspecified laterality Recommend:  The patient has atypical pain symptoms for vascular disease and on exam I do not find evidence of vascular pathology that would explain the patient's symptoms.  Noninvasive studies do not identify significant vascular problems  I suspect the patient is c/o pseudoclaudication.  Patient should have an evaluation of the LS spine which I defer to the primary  service, pain management or the Spine service.  The patient should continue walking and begin a more formal exercise program. The patient should continue his antiplatelet therapy and aggressive treatment of the lipid abnormalities.   3. Uncomplicated asthma, unspecified asthma severity, unspecified whether persistent Continue pulmonary medications and aerosols as already ordered, these medications have been reviewed and there are no changes at this time.   4. Primary osteoarthritis involving multiple joints Continue medications to treat the patient's degenerative disease as already ordered, these medications have been reviewed and there are no changes at this time.  Continued activity and therapy was stressed.    Devon Fogo, MD  05/24/2023 9:11 AM

## 2023-05-31 ENCOUNTER — Other Ambulatory Visit: Payer: Self-pay | Admitting: Physician Assistant

## 2023-05-31 DIAGNOSIS — M5442 Lumbago with sciatica, left side: Secondary | ICD-10-CM

## 2023-05-31 DIAGNOSIS — G8929 Other chronic pain: Secondary | ICD-10-CM

## 2023-06-05 ENCOUNTER — Ambulatory Visit: Payer: Self-pay

## 2023-06-12 ENCOUNTER — Ambulatory Visit
Admission: RE | Admit: 2023-06-12 | Discharge: 2023-06-12 | Disposition: A | Discharge status: Home / Self Care | Source: Ambulatory Visit | Attending: Physician Assistant | Admitting: Physician Assistant

## 2023-06-12 DIAGNOSIS — G8929 Other chronic pain: Secondary | ICD-10-CM | POA: Diagnosis present

## 2023-06-12 DIAGNOSIS — M5442 Lumbago with sciatica, left side: Secondary | ICD-10-CM | POA: Insufficient documentation

## 2023-06-22 ENCOUNTER — Encounter (INDEPENDENT_AMBULATORY_CARE_PROVIDER_SITE_OTHER): Payer: Self-pay

## 2023-11-22 ENCOUNTER — Ambulatory Visit (INDEPENDENT_AMBULATORY_CARE_PROVIDER_SITE_OTHER): Admitting: Vascular Surgery

## 2023-12-06 ENCOUNTER — Ambulatory Visit (INDEPENDENT_AMBULATORY_CARE_PROVIDER_SITE_OTHER): Admitting: Vascular Surgery

## 2023-12-07 ENCOUNTER — Ambulatory Visit (INDEPENDENT_AMBULATORY_CARE_PROVIDER_SITE_OTHER): Admitting: Nurse Practitioner

## 2023-12-07 ENCOUNTER — Encounter (INDEPENDENT_AMBULATORY_CARE_PROVIDER_SITE_OTHER): Payer: Self-pay | Admitting: Nurse Practitioner

## 2023-12-07 VITALS — BP 103/75 | HR 79 | Resp 18 | Wt 276.8 lb

## 2023-12-07 DIAGNOSIS — I890 Lymphedema, not elsewhere classified: Secondary | ICD-10-CM | POA: Diagnosis not present

## 2023-12-19 ENCOUNTER — Encounter (INDEPENDENT_AMBULATORY_CARE_PROVIDER_SITE_OTHER): Payer: Self-pay | Admitting: Nurse Practitioner

## 2023-12-19 NOTE — Progress Notes (Signed)
 Subjective:    Patient ID: Robin Hoffman, female    DOB: 1975/08/16, 48 y.o.   MRN: [id number] Chief Complaint  Patient presents with   Follow-up    6 month follow up    The patient returns to the office for followup evaluation regarding leg swelling.  The swelling has persisted and the pain associated with swelling continues. There have not been any interval development of a ulcerations or wounds.  Since the previous visit the patient has been wearing graduated compression stockings and has noted little if any improvement in the lymphedema. The patient has been using compression routinely morning until night.  The patient also states elevation during the day and exercise is being done too.      Review of Systems  Cardiovascular:  Positive for leg swelling.  All other systems reviewed and are negative.      Objective:   Physical Exam Vitals reviewed.  HENT:     Head: Normocephalic.  Cardiovascular:     Rate and Rhythm: Normal rate.  Pulmonary:     Effort: Pulmonary effort is normal.  Musculoskeletal:     Right lower leg: Edema present.     Left lower leg: Edema present.  Skin:    General: Skin is warm and dry.  Neurological:     Mental Status: She is alert and oriented to person, place, and time.  Psychiatric:        Mood and Affect: Mood normal.        Behavior: Behavior normal.        Thought Content: Thought content normal.        Judgment: Judgment normal.     BP 103/75   Pulse 79   Resp 18   Wt 276 lb 12.8 oz (125.6 kg)   BMI 54.06 kg/m   Past Medical History:  Diagnosis Date   Arthritis    Asthma    Fibroids    Hyperlipidemia    Lymphedema    Migraines    Vertigo     Social History   Socioeconomic History   Marital status: Single    Spouse name: Not on file   Number of children: Not on file   Years of education: Not on file   Highest education level: Not on file  Occupational History   Not on file  Tobacco Use   Smoking  status: Never   Smokeless tobacco: Never  Vaping Use   Vaping status: Never Used  Substance and Sexual Activity   Alcohol use: Never   Drug use: Never   Sexual activity: Not on file  Other Topics Concern   Not on file  Social History Narrative   Not on file   Social Drivers of Health   Financial Resource Strain: Not on file  Food Insecurity: No Food Insecurity (07/02/2022)   Hunger Vital Sign    Worried About Running Out of Food in the Last Year: Never true    Ran Out of Food in the Last Year: Never true  Transportation Needs: No Transportation Needs (07/02/2022)   PRAPARE - Administrator, Civil Service (Medical): No    Lack of Transportation (Non-Medical): No  Physical Activity: Not on file  Stress: Not on file  Social Connections: Not on file  Intimate Partner Violence: Not At Risk (07/02/2022)   Humiliation, Afraid, Rape, and Kick questionnaire    Fear of Current or Ex-Partner: No    Emotionally Abused: No    Physically  Abused: No    Sexually Abused: No    Past Surgical History:  Procedure Laterality Date   CESAREAN SECTION     4   KNEE ARTHROSCOPY Right     Family History  Problem Relation Age of Onset   Hypertension Mother    Cancer Mother    Thyroid disease Mother    Anemia Mother    Stroke Father    Hyperlipidemia Father    Hypertension Father    Lung disease Father    Cataracts Father    Heart attack Father     Allergies  Allergen Reactions   Hydrocodone Nausea Only    Headache/dizziness also Headache/dizziness also Headache/dizziness also    Tramadol Nausea Only    Headache and dizziness as well Headache and dizziness as well Headache and dizziness as well    Codeine Nausea Only    Headache and dizziness as well Headache and dizziness as well Headache and dizziness as well    Penicillins     Other reaction(s): Unknown Other reaction(s): UNKNOWN In childhood records/reaction unknown Was in her childhood records-no known  reaction Other reaction(s): UNKNOWN        Latest Ref Rng & Units 07/02/2022    3:56 PM  CBC  WBC 4.0 - 10.5 K/uL 7.8   Hemoglobin 12.0 - 15.0 g/dL 89.1   Hematocrit 63.9 - 46.0 % 34.1   Platelets 150 - 400 K/uL 302       CMP  No results found for: NA, K, CL, CO2, GLUCOSE, BUN, CREATININE, CALCIUM, PROT, ALBUMIN, AST, ALT, ALKPHOS, BILITOT, GFR, EGFR, GFRNONAA   No results found.     Assessment & Plan:   1. Lymphedema (Primary) Recommend:  No surgery or intervention at this point in time.   The Patient is CEAP C4sEpAsPr.  The patient has been wearing compression for more than 12 weeks with no or little benefit.  The patient has been exercising daily for more than 12 weeks. The patient has been elevating and taking OTC pain medications for more than 12 weeks.  None of these have have eliminated the pain related to the lymphedema or the discomfort regarding excessive swelling and venous congestion.    I have reviewed my discussion with the patient regarding lymphedema and why it  causes symptoms.  Patient will continue wearing graduated compression on a daily basis. The patient should put the compression on first thing in the morning and removing them in the evening. The patient should not sleep in the compression.   In addition, behavioral modification throughout the day will be continued.  This will include frequent elevation (such as in a recliner), use of over the counter pain medications as needed and exercise such as walking.  The systemic causes for chronic edema such as liver, kidney and cardiac etiologies do not appear to have significant changed over the past year.    The patient has chronic , severe lymphedema with hyperpigmentation of the skin and has done MLD, skin care, medication, diet, exercise, elevation and compression for 4 weeks with no improvement,  I am recommending a lymphedema pump.  The patient still has stage 3  lymphedema and therefore, I believe that a lymph pump is needed to improve the control of the patient's lymphedema and improve the quality of life.  Additionally, a lymph pump is warranted because it will reduce the risk of cellulitis and ulceration in the future.  Patient should follow-up in six months    Current Outpatient Medications  on File Prior to Visit  Medication Sig Dispense Refill   albuterol (PROVENTIL) (2.5 MG/3ML) 0.083% nebulizer solution Take 2.5 mg by nebulization 3 (three) times daily. (Patient taking differently: Take 2.5 mg by nebulization every 4 (four) hours as needed.)     benzonatate (TESSALON) 200 MG capsule Take 200 mg by mouth 3 (three) times daily as needed.     budesonide-formoterol (SYMBICORT) 80-4.5 MCG/ACT inhaler Inhale into the lungs.     Cholecalciferol (D 1000) 25 MCG (1000 UT) capsule Take 1 capsule by mouth daily.     docusate sodium (COLACE) 100 MG capsule Take 100 mg by mouth daily.     EPINEPHrine 0.3 mg/0.3 mL IJ SOAJ injection Inject 0.3 mg into the muscle as needed.     furosemide  (LASIX ) 20 MG tablet Take 1 tablet (20 mg total) by mouth as needed for edema. No more than one dose in a 24 hour period.  Do not take more than 2 days in a row 30 tablet 0   montelukast (SINGULAIR) 10 MG tablet Take 10 mg by mouth daily as needed.     omeprazole (PRILOSEC) 20 MG capsule Take 20 mg by mouth as needed.     potassium chloride (KLOR-CON 10) 10 MEQ tablet Take 1 tablet by mouth daily.     ferrous sulfate 325 (65 FE) MG EC tablet Take 325 mg by mouth 3 (three) times daily with meals. (Patient not taking: Reported on 12/07/2023)     meloxicam (MOBIC) 15 MG tablet Take 15 mg by mouth daily. (Patient not taking: Reported on 12/07/2023)     ondansetron (ZOFRAN) 4 MG tablet Take 4 mg by mouth every 4 (four) hours as needed.     No current facility-administered medications on file prior to visit.    There are no Patient Instructions on file for this visit. Return in  about 6 months (around 06/05/2024) for Lymphedema.   Marlenne Ridge E Delores Edelstein, NP

## 2024-01-17 ENCOUNTER — Telehealth (INDEPENDENT_AMBULATORY_CARE_PROVIDER_SITE_OTHER): Payer: Self-pay | Admitting: Vascular Surgery

## 2024-01-17 ENCOUNTER — Other Ambulatory Visit (INDEPENDENT_AMBULATORY_CARE_PROVIDER_SITE_OTHER): Payer: Self-pay | Admitting: Nurse Practitioner

## 2024-01-17 DIAGNOSIS — M79606 Pain in leg, unspecified: Secondary | ICD-10-CM

## 2024-01-17 NOTE — Telephone Encounter (Signed)
 Referral placed.

## 2024-01-17 NOTE — Telephone Encounter (Signed)
 Patient LVM on [facility] line stating that [facility] was referring her to a back/spine doctor but hasn't heard anything yet. Didn't see referral in system. Please advise.

## 2024-01-18 NOTE — Telephone Encounter (Signed)
 Call to patient to advise this has been sent over for her as of 01/17/24 and to give it about a week and to reach back out to us  if she still does not hear anything from the referred office. Patient was unavailable left voicemail with the above information.

## 2024-02-18 NOTE — Progress Notes (Unsigned)
 "  Referring Physician:  Delores Orvin BRAVO, NP [address] [place],  [place] [address]  Primary Physician:  Gilotra, Shivas, FNP  History of Present Illness: 02/18/2024*** Ms. Robin Hoffman has a history of chronic venous insufficiency, asthma, obesity, lymphedema, hyperlipidemia, migraines.   Back pain Bilateral leg pain radiating into the feet with burning sensation  Duration: *** Location: *** Quality: *** Severity: ***  Precipitating: aggravated by *** Modifying factors: made better by *** Weakness: none Timing: ***  Tobacco use: Does not smoke.   Bowel/Bladder Dysfunction: none  Conservative measures:  Physical therapy: *** has not participated in Multimodal medical therapy including regular antiinflammatories: *** Meloxicam, Diclofenac Injections: *** no epidural steroid injections  Past Surgery: ***no spine surgery  Rashema Randie Dais has ***no symptoms of cervical myelopathy.  The symptoms are causing a significant impact on the patient's life.   Review of Systems:  A 10 point review of systems is negative, except for the pertinent positives and negatives detailed in the HPI.  Past Medical History: Past Medical History:  Diagnosis Date   Arthritis    Asthma    Fibroids    Hyperlipidemia    Lymphedema    Migraines    Vertigo     Past Surgical History: Past Surgical History:  Procedure Laterality Date   CESAREAN SECTION     4   KNEE ARTHROSCOPY Right     Allergies: Allergies as of 02/24/2024 - Review Complete 12/19/2023  Allergen Reaction Noted   Hydrocodone Nausea Only 03/16/2017   Tramadol Nausea Only 03/16/2017   Codeine Nausea Only 03/16/2017   Penicillins  02/24/2015    Medications: Outpatient Encounter Medications as of 02/24/2024  Medication Sig   albuterol (PROVENTIL) (2.5 MG/3ML) 0.083% nebulizer solution Take 2.5 mg by nebulization 3 (three) times daily. (Patient taking differently: Take 2.5 mg by  nebulization every 4 (four) hours as needed.)   benzonatate (TESSALON) 200 MG capsule Take 200 mg by mouth 3 (three) times daily as needed.   budesonide-formoterol (SYMBICORT) 80-4.5 MCG/ACT inhaler Inhale into the lungs.   Cholecalciferol (D 1000) 25 MCG (1000 UT) capsule Take 1 capsule by mouth daily.   docusate sodium (COLACE) 100 MG capsule Take 100 mg by mouth daily.   EPINEPHrine 0.3 mg/0.3 mL IJ SOAJ injection Inject 0.3 mg into the muscle as needed.   ferrous sulfate 325 (65 FE) MG EC tablet Take 325 mg by mouth 3 (three) times daily with meals. (Patient not taking: Reported on 12/07/2023)   furosemide  (LASIX ) 20 MG tablet Take 1 tablet (20 mg total) by mouth as needed for edema. No more than one dose in a 24 hour period.  Do not take more than 2 days in a row   meloxicam (MOBIC) 15 MG tablet Take 15 mg by mouth daily. (Patient not taking: Reported on 12/07/2023)   montelukast (SINGULAIR) 10 MG tablet Take 10 mg by mouth daily as needed.   omeprazole (PRILOSEC) 20 MG capsule Take 20 mg by mouth as needed.   ondansetron (ZOFRAN) 4 MG tablet Take 4 mg by mouth every 4 (four) hours as needed.   potassium chloride (KLOR-CON 10) 10 MEQ tablet Take 1 tablet by mouth daily.   No facility-administered encounter medications on file as of 02/24/2024.    Social History: Social History[1]  Family Medical History: Family History  Problem Relation Age of Onset   Hypertension Mother    Cancer Mother    Thyroid disease Mother    Anemia Mother  Stroke Father    Hyperlipidemia Father    Hypertension Father    Lung disease Father    Cataracts Father    Heart attack Father     Physical Examination: There were no vitals filed for this visit.  General: Patient is well developed, well nourished, calm, collected, and in no apparent distress. Attention to examination is appropriate.  Respiratory: Patient is breathing without any difficulty.   NEUROLOGICAL:     Awake, alert, oriented to  person, place, and time.  Speech is clear and fluent. Fund of knowledge is appropriate.   Cranial Nerves: Pupils equal round and reactive to light.  Facial tone is symmetric.    *** ROM of cervical spine *** pain *** posterior cervical tenderness. *** tenderness in bilateral trapezial region.   *** ROM of lumbar spine *** pain *** posterior lumbar tenderness.   No abnormal lesions on exposed skin.   Strength: Side Biceps Triceps Deltoid Interossei Grip Wrist Ext. Wrist Flex.  R 5 5 5 5 5 5 5   L 5 5 5 5 5 5 5    Side Iliopsoas Quads Hamstring PF DF EHL  R 5 5 5 5 5 5   L 5 5 5 5 5 5    Reflexes are ***2+ and symmetric at the biceps, brachioradialis, patella and achilles.   Hoffman's is absent.  Clonus is not present.   Bilateral upper and lower extremity sensation is intact to light touch.     Gait is normal.   ***No difficulty with tandem gait.    Medical Decision Making  Imaging: Lumbar MRI dated 06/12/23:  FINDINGS: Segmentation: For the purposes of this dictation there are presumed to be 5 non-rib-bearing lumbar type vertebral bodies. The lowest well-formed disc space is labeled L5-S1.   Alignment: Slightly exaggerated lumbar lordosis. No significant listhesis.   Vertebrae: Vertebral body heights are maintained. No bone marrow edema. No suspicious osseous lesion.   Conus medullaris and cauda equina: Conus extends to the T12-L1 level. Conus and cauda equina appear normal.   Paraspinal and other soft tissues: There is mild edema within the interspinous ligament at L4-5. The paraspinal soft tissues are otherwise unremarkable.   Disc levels:   T12-L1: No significant disc bulge. No significant spinal canal stenosis or foraminal stenosis.   L1-2: Disc desiccation and mild disc height loss. Small disc bulge which indents the ventral thecal sac. Moderate facet arthrosis. Mild spinal canal stenosis. Mild foraminal stenosis on the right.   L2-3: Disc desiccation.  Diffuse disc bulge which indents the ventral thecal sac with lateral recess narrowing. Moderate to severe facet arthrosis indents the dorsal thecal sac. Severe spinal canal stenosis with crowding of the cauda equina nerve roots. There is moderate right and mild-to-moderate left foraminal stenosis.   L3-4: Disc desiccation. Diffuse disc bulge indents the ventral thecal sac with lateral recess narrowing. Severe foraminal stenosis indents the dorsal thecal sac. Severe spinal canal stenosis with crowding of the cauda equina nerve roots. There is moderate right and mild left foraminal stenosis.   L4-5: Diffuse disc bulge and small left paracentral disc protrusion with lateral recess narrowing without significant impingement upon the traversing nerve roots. Moderate facet arthrosis. There is mild-to-moderate spinal canal stenosis. Severe right and moderate left foraminal stenosis.   L5-S1: Diffuse disc bulge which indents the ventral thecal sac. Prominence of epidural fat at this level. Tapering of the thecal sac. Mild facet arthrosis. There is no significant foraminal stenosis.   IMPRESSION: Degenerative changes as above. Severe spinal  canal stenosis at L2-3 and L3-4.   Multilevel foraminal stenosis, greatest and severe on the right at L4-5. Additional moderate foraminal stenosis on the right at L2-3 and L3-4 and on the left at L4-5.   Facet arthrosis throughout the lumbar spine most pronounced at L2-3 and L3-4.   Mild edema in the interspinous ligament at L4-5 which may reflect ligamentous injury/strain.     Electronically Signed   By: Donnice Mania M.D.   On: 07/11/2023 21:57        I have personally reviewed the images and agree with the above interpretation.  Assessment and Plan: Ms. Killian is a pleasant 49 y.o. female has ***  Treatment options discussed with patient and following plan made:   - Order for physical therapy for *** spine ***. Patient to call to  schedule appointment. *** - Continue current medications including ***. Reviewed dosing and side effects.  - Prescription for ***. Reviewed dosing and side effects. Take with food.  - Prescription for *** to take prn muscle spasms. Reviewed dosing and side effects. Discussed this can cause drowsiness.  - MRI of *** to further evaluate *** radiculopathy. No improvement time or medications (***).  - Referral to [facility] at [facility] to discuss possible *** injections.  - Will schedule phone visit to review MRI results once I get them back.   I spent a total of *** minutes in face-to-face and non-face-to-face activities related to this patient's care today including review of outside records, review of imaging, review of symptoms, physical exam, discussion of differential diagnosis, discussion of treatment options, and documentation.   Thank you for involving me in the care of this patient.   Glade Boys PA-C Dept. of Neurosurgery      [1]  Social History Tobacco Use   Smoking status: Never   Smokeless tobacco: Never  Vaping Use   Vaping status: Never Used  Substance Use Topics   Alcohol use: Never   Drug use: Never   "

## 2024-02-24 ENCOUNTER — Ambulatory Visit: Admitting: Orthopedic Surgery

## 2024-03-28 ENCOUNTER — Ambulatory Visit

## 2024-06-05 ENCOUNTER — Ambulatory Visit (INDEPENDENT_AMBULATORY_CARE_PROVIDER_SITE_OTHER): Admitting: Nurse Practitioner
